# Patient Record
Sex: Male | Born: 1935 | Race: White | Hispanic: No | Marital: Married | Smoking: Former smoker
Health system: Southern US, Community
[De-identification: ages and names within clinical notes are randomized; demographics above are authoritative.]

## PROBLEM LIST (undated history)

## (undated) DIAGNOSIS — G459 Transient cerebral ischemic attack, unspecified: Secondary | ICD-10-CM

## (undated) DIAGNOSIS — R569 Unspecified convulsions: Secondary | ICD-10-CM

## (undated) DIAGNOSIS — M1990 Unspecified osteoarthritis, unspecified site: Secondary | ICD-10-CM

## (undated) DIAGNOSIS — I10 Essential (primary) hypertension: Secondary | ICD-10-CM

## (undated) HISTORY — PX: COLONOSCOPY: SHX5424

## (undated) HISTORY — PX: TONSILLECTOMY: SUR1361

## (undated) HISTORY — PX: OTHER SURGICAL HISTORY: SHX169

## (undated) HISTORY — PX: ESOPHAGOGASTRODUODENOSCOPY: SHX1529

---

## 1974-08-06 HISTORY — PX: ANKLE SURGERY: SHX546

## 2017-05-28 ENCOUNTER — Encounter (INDEPENDENT_AMBULATORY_CARE_PROVIDER_SITE_OTHER): Payer: Self-pay | Admitting: Orthopaedic Surgery

## 2017-05-28 ENCOUNTER — Ambulatory Visit (INDEPENDENT_AMBULATORY_CARE_PROVIDER_SITE_OTHER): Payer: Medicare HMO | Admitting: Orthopaedic Surgery

## 2017-05-28 VITALS — BP 155/80 | HR 65 | Ht 69.0 in | Wt 155.0 lb

## 2017-05-28 DIAGNOSIS — M48061 Spinal stenosis, lumbar region without neurogenic claudication: Secondary | ICD-10-CM

## 2017-05-28 DIAGNOSIS — M9983 Other biomechanical lesions of lumbar region: Secondary | ICD-10-CM

## 2017-05-28 DIAGNOSIS — M48062 Spinal stenosis, lumbar region with neurogenic claudication: Secondary | ICD-10-CM | POA: Diagnosis not present

## 2017-05-28 NOTE — Progress Notes (Signed)
Office Visit Note   Patient: Matthew Mccullough           Date of Birth: 06-16-1934           MRN: [id number] Visit Date: 05/28/2017              Requested by: No referring provider defined for this encounter. PCP: Elijio Miles., MD   Assessment & Plan: Visit Diagnoses:  1. Spinal stenosis of lumbar region with neurogenic claudication   2. Foraminal stenosis of lumbar region         Central stenosis L4-5, Foraminal stenosis left L5-S1  Plan: Patient's currently doing better. If he has increase in his symptoms he once to consider  trying 1 more epidural. We discussed operative choices which would be central decompression L4-5 for the lateral recess and moderate central stenosis as well as left L5-S1 foraminotomy. He will call for productive epidural done here or he can go back and have it done at [facility]. No change in his medications. Gabapentin has given him some 50% relief.  Follow-Up Instructions: No Follow-up on file.   Orders:  No orders of the defined types were placed in this encounter.  No orders of the defined types were placed in this encounter.     Procedures: No procedures performed   Clinical Data: No additional findings.   Subjective: Chief Complaint  Patient presents with  . Lower Back - Pain    HPI 83 year old male seen with back and left leg pain more so than right. He's had epidurals in the past with some relief. Currently feels like he is doing somewhat better. Tramadol did not seem to help him but he's been taking some gabapentin and that seems to improve his symptoms the probably 50%. He denies previous lumbar surgery. He's had an MRI scan done on 04/21/2017 which shows moderate central stenosis at L4-5 with bilateral lateral recess stenosis. L5-S1 shows severe left neural foraminal stenosis. Incidental finding is a 3.2 distal aortic aneurysm stable with an endograft. Patient avoids going out walking in the neighborhood with his wife but he rides and  excise bike for cardiovascular fitness. He's not noticed that he's had to stop when he standing at some functions are in line at an event.  Review of Systems positive for history stomach problems, diabetes, hypertension, lumbar stenosis problems. 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 Previous aortic aneurysm endograft.   Objective: Vital Signs: BP (!) 155/80   Pulse 65   Ht 5\' 9"  (1.753 m)   Wt 155 lb (70.3 kg)   BMI 22.89 kg/m   Physical Exam  Constitutional: He is oriented to person, place, and time. He appears well-developed and well-nourished.  HENT:  Head: Normocephalic and atraumatic.  Eyes: Pupils are equal, round, and reactive to light. EOM are normal.  Neck: No tracheal deviation present. No thyromegaly present.  Cardiovascular: Normal rate.   Pulmonary/Chest: Effort normal. He has no wheezes.  Abdominal: Soft. Bowel sounds are normal.  Neurological: He is alert and oriented to person, place, and time.  Skin: Skin is warm and dry. Capillary refill takes less than 2 seconds.  Psychiatric: He has a normal mood and affect. His behavior is normal. Judgment and thought content normal.    Ortho Exam knee and ankle jerks are 1+ and symmetrical. Anterior tib EHL gastrocsoleus is strong. Dorsal foot sensation is intact no pitting edema negative straight leg raising 90. Good quad strength. A mature with heel toe gait. One episode with the rapidly  turning changing he stumbled but does not report any falls.  Specialty Comments:  No specialty comments available.  Imaging: Lumbar MRI scan from 04/21/2017 as above reviewed on E with patient as well as report images also his wife and daughter.   PMFS History: There are no active problems to display for this patient.  No past medical history on file.  No family history on file.  No past surgical history on file. Social History   Occupational History  . Not on file.   Social History Main Topics  . Smoking status: Former Smoker    Quit  date: 1980  . Smokeless tobacco: Never Used  . Alcohol use Yes     Comment: wine  . Drug use: No  . Sexual activity: Not on file

## 2017-07-22 ENCOUNTER — Telehealth (INDEPENDENT_AMBULATORY_CARE_PROVIDER_SITE_OTHER): Payer: Self-pay | Admitting: Orthopedic Surgery

## 2017-07-22 NOTE — Telephone Encounter (Signed)
Patient's daughter, Olegario MessierKathy, called to schedule lumbar spine surgery for her father.  They would like to have this done some time in January.  Please review and fill out surgery sheet and I will call Olegario MessierKathy back to schedule. Thank you!

## 2017-07-23 NOTE — Telephone Encounter (Signed)
Blue sheet done. Not sure where the disc is of his lumbar MRI. U can ask daughter when U call. Will need [facility]thanks blue sheet on your desk.  Thank you.

## 2017-08-08 ENCOUNTER — Telehealth (INDEPENDENT_AMBULATORY_CARE_PROVIDER_SITE_OTHER): Payer: Self-pay | Admitting: Radiology

## 2017-08-08 NOTE — Telephone Encounter (Signed)
Patient's daughter called, said patient has questions prior to scheduling surgery.  Would you be so kind as to call him?  [phone], thanks.

## 2017-08-08 NOTE — Telephone Encounter (Signed)
I called discussed.  

## 2017-08-20 ENCOUNTER — Ambulatory Visit (INDEPENDENT_AMBULATORY_CARE_PROVIDER_SITE_OTHER): Payer: Medicare HMO | Admitting: Orthopaedic Surgery

## 2017-09-03 ENCOUNTER — Ambulatory Visit (INDEPENDENT_AMBULATORY_CARE_PROVIDER_SITE_OTHER): Payer: Medicare HMO | Admitting: Orthopaedic Surgery

## 2017-09-03 ENCOUNTER — Encounter (INDEPENDENT_AMBULATORY_CARE_PROVIDER_SITE_OTHER): Payer: Self-pay | Admitting: Orthopaedic Surgery

## 2017-09-03 VITALS — Ht 68.0 in | Wt 163.0 lb

## 2017-09-03 DIAGNOSIS — M48062 Spinal stenosis, lumbar region with neurogenic claudication: Secondary | ICD-10-CM

## 2017-09-03 DIAGNOSIS — M5126 Other intervertebral disc displacement, lumbar region: Secondary | ICD-10-CM | POA: Diagnosis not present

## 2017-09-04 ENCOUNTER — Encounter (INDEPENDENT_AMBULATORY_CARE_PROVIDER_SITE_OTHER): Payer: Self-pay | Admitting: Orthopaedic Surgery

## 2017-09-04 DIAGNOSIS — M48062 Spinal stenosis, lumbar region with neurogenic claudication: Secondary | ICD-10-CM | POA: Insufficient documentation

## 2017-09-04 DIAGNOSIS — M5126 Other intervertebral disc displacement, lumbar region: Secondary | ICD-10-CM | POA: Insufficient documentation

## 2017-09-04 NOTE — Progress Notes (Signed)
Office Visit Note   Patient: Elizbeth SquiresDavid L Leonhart           Date of Birth: 1934/04/13           MRN: [id number] Visit Date: 09/03/2017              Requested by: Elijio MilesWeaver, John W., MD [address] [place], [place] [address] PCP: Elijio MilesWeaver, John W., MD   Assessment & Plan: Visit Diagnoses:  1.  L4-5 lumbar spinal stenosis with neurogenic claudication.  2.  Left L5-S1 disc protrusion with radiculopathy.   Plan: Impression at the L4-5 level there is lateral recess and central stenosis and left L5-S1 microdiscectomy foraminotomy.  Questions elicited and answered with surgery discussed questions were answered.  He understands and requests we proceed.    Follow-Up Instructions: Return in about 2 weeks (around 09/17/2017).   Orders:  No orders of the defined types were placed in this encounter.  No orders of the defined types were placed in this encounter.     Procedures: No procedures performed   Clinical Data: No additional findings.   Subjective: Chief Complaint  Patient presents with  . Lower Back - Pain, Follow-up    HPI 84 year old male returns wife today.  Discussed plan surgery for the lumbar spine for his L4-5 stenosis with some claudication symptoms at 100 feet left leg symptoms from lateral disc protrusion L5-S1.  Stop and stand or sit down or walking approximately 100 feet.  He gets relief with supine position.  His left leg pain is intermittent at times rated at severe.  He has had anti-inflammatories, epidural injections and also gabapentin symptoms.  Increase over the last year.  MRI scan 04/21/2017 showed severe neuroforaminal stenosis L5-S1 with impingement on the left L5 nerve root.  Bilateral lateral recess stenosis at L4-5 with moderate central stenosis at L4-5 level.  Severe right and moderate left neuroforaminal stenosis.  The last 3 years he has had several epidurals.  Patient has type 2 diabetes and was on metformin and had an episode with some hypoglycemia  requiring admission in the hospital and metformin has been stopped.  Review of Systems positive for hypertension on Norvasc and Lopressor, Neurontin 300 mg at night for his lumbar stenosis, history of aortic stent for 3.2 cm aneurysm with endograft.  Former smoker quit in 1980.  Normal carotid ultrasound and normal MRI and CT scan of the brain Oct. 2018 time of hypoglycemia.   Objective: Vital Signs: Ht 5\' 8"  (1.727 m)   Wt 163 lb (73.9 kg)   BMI 24.78 kg/m   Physical Exam  Constitutional: He is oriented to person, place, and time. He appears well-developed and well-nourished.  HENT:  Head: Normocephalic and atraumatic.  Eyes: EOM are normal. Pupils are equal, round, and reactive to light.  Neck: No tracheal deviation present. No thyromegaly present.  Cardiovascular: Normal rate.  Pulmonary/Chest: Effort normal. He has no wheezes.  Abdominal: Soft. Bowel sounds are normal.  Neurological: He is alert and oriented to person, place, and time.  Skin: Skin is warm and dry. Capillary refill takes less than 2 seconds.  Psychiatric: He has a normal mood and affect. His behavior is normal. Judgment and thought content normal.    Ortho Exam left some sciatic notch tenderness.  Distal pulses are intact.  Tib EHL is active.  Ambulate.  Hip range of motion.  No peripheral edema.  Specialty Comments:  No specialty comments available.  Imaging: MRI scan 04/21/2017 shows severe left neuroforaminal  stenosis L5-S1 with impingement on the exiting left L5 nerve root.  Bilateral lateral recess stenosis at L4-5 with moderate spinal stenosis and severe right and moderate left neuroforaminal stenosis.  3.2 cm distal abdominal aortic aneurysm with endograft.   PMFS History: Patient Active Problem List   Diagnosis Date Noted  . Spinal stenosis of lumbar region with neurogenic claudication 09/04/2017  . Protrusion of lumbar intervertebral disc 09/04/2017   History reviewed. No pertinent past medical  history.  History reviewed. No pertinent family history.  History reviewed. No pertinent surgical history. Social History   Occupational History  . Not on file  Tobacco Use  . Smoking status: Former Smoker    Last attempt to quit: 1980    Years since quitting: 39.1  . Smokeless tobacco: Never Used  Substance and Sexual Activity  . Alcohol use: Yes    Comment: wine  . Drug use: No  . Sexual activity: Not on file

## 2017-09-05 NOTE — Pre-Procedure Instructions (Signed)
Matthew SquiresDavid L Mccullough  09/05/2017      [facility] [place], [place] - [id number] [place] [id number] Phone: [phone] Fax: [phone]    Your procedure is scheduled on February 4  [facility] Admitting at 1030 A.M.  Call this number if you have problems the morning of surgery:  [phone]   Remember:  Do not eat food or drink liquids after midnight.  Continue all medications as directed by your physician except follow these medication instructions before surgery below   Take these medicines the morning of surgery with A SIP OF WATER  amLODipine (NORVASC) fluticasone (FLONASE) HYDROcodone-acetaminophen (NORCO)  metoprolol tartrate (LOPRESSOR) omeprazole (PRILOSEC)  7 days prior to surgery STOP taking any Aspirin(unless otherwise instructed by your surgeon), Aleve, Naproxen, Ibuprofen, Motrin, Advil, Goody's, BC's, all herbal medications, fish oil, and all vitamins  Follow your doctors instructions regarding your Aspirin.  If no instructions were given by your doctor, then you will need to call the prescribing office office to get instructions.      Do not wear jewelry  Do not wear lotions, powders, or cologne, or deodorant.  Men may shave face and neck.  Do not bring valuables to the hospital.  [facility] is not responsible for any belongings or valuables.  Contacts, dentures or bridgework may not be worn into surgery.  Leave your suitcase in the car.  After surgery it may be brought to your room.  For patients admitted to the hospital, discharge time will be determined by your treatment team.  Patients discharged the day of surgery will not be allowed to drive home.    Special instructions:   [place]- Preparing For Surgery  Before surgery, you can play an important role. Because skin is not sterile, your skin needs to be as free of germs as possible. You can reduce  the number of germs on your skin by washing with CHG (chlorahexidine gluconate) Soap before surgery.  CHG is an antiseptic cleaner which kills germs and bonds with the skin to continue killing germs even after washing.  Please do not use if you have an allergy to CHG or antibacterial soaps. If your skin becomes reddened/irritated stop using the CHG.  Do not shave (including legs and underarms) for at least 48 hours prior to first CHG shower. It is OK to shave your face.  Please follow these instructions carefully.   1. Shower the NIGHT BEFORE SURGERY and the MORNING OF SURGERY with CHG.   2. If you chose to wash your hair, wash your hair first as usual with your normal shampoo.  3. After you shampoo, rinse your hair and body thoroughly to remove the shampoo.  4. Use CHG as you would any other liquid soap. You can apply CHG directly to the skin and wash gently with a scrungie or a clean washcloth.   5. Apply the CHG Soap to your body ONLY FROM THE NECK DOWN.  Do not use on open wounds or open sores. Avoid contact with your eyes, ears, mouth and genitals (private parts). Wash Face and genitals (private parts)  with your normal soap.  6. Wash thoroughly, paying special attention to the area where your surgery will be performed.  7. Thoroughly rinse your body with warm water from the neck down.  8. DO NOT shower/wash with your normal soap after using and rinsing off the CHG Soap.  9. Pat yourself dry with a CLEAN TOWEL.  10. Wear CLEAN PAJAMAS to bed the night before surgery, wear comfortable clothes the morning of surgery  11. Place CLEAN SHEETS on your bed the night of your first shower and DO NOT SLEEP WITH PETS.    Day of Surgery: Do not apply any deodorants/lotions. Please wear clean clothes to the hospital/surgery center.      Please read over the following fact sheets that you were given.

## 2017-09-06 ENCOUNTER — Encounter (HOSPITAL_COMMUNITY): Payer: Self-pay

## 2017-09-06 ENCOUNTER — Encounter (HOSPITAL_COMMUNITY)
Admission: RE | Admit: 2017-09-06 | Discharge: 2017-09-06 | Disposition: A | Discharge status: Home / Self Care | Payer: Medicare HMO | Source: Ambulatory Visit | Attending: Orthopaedic Surgery | Admitting: Orthopaedic Surgery

## 2017-09-06 ENCOUNTER — Other Ambulatory Visit (HOSPITAL_COMMUNITY): Payer: Self-pay

## 2017-09-06 ENCOUNTER — Other Ambulatory Visit: Payer: Self-pay

## 2017-09-06 ENCOUNTER — Encounter (HOSPITAL_COMMUNITY)
Admission: RE | Admit: 2017-09-06 | Discharge: 2017-09-06 | Disposition: A | Discharge status: Home / Self Care | Payer: Medicare HMO | Source: Ambulatory Visit | Attending: Surgery | Admitting: Surgery

## 2017-09-06 DIAGNOSIS — Z79899 Other long term (current) drug therapy: Secondary | ICD-10-CM | POA: Diagnosis not present

## 2017-09-06 DIAGNOSIS — I10 Essential (primary) hypertension: Secondary | ICD-10-CM | POA: Diagnosis not present

## 2017-09-06 DIAGNOSIS — M48062 Spinal stenosis, lumbar region with neurogenic claudication: Secondary | ICD-10-CM | POA: Diagnosis present

## 2017-09-06 DIAGNOSIS — Z7982 Long term (current) use of aspirin: Secondary | ICD-10-CM | POA: Diagnosis not present

## 2017-09-06 DIAGNOSIS — Z01818 Encounter for other preprocedural examination: Secondary | ICD-10-CM

## 2017-09-06 DIAGNOSIS — Z7951 Long term (current) use of inhaled steroids: Secondary | ICD-10-CM | POA: Diagnosis not present

## 2017-09-06 DIAGNOSIS — Z8673 Personal history of transient ischemic attack (TIA), and cerebral infarction without residual deficits: Secondary | ICD-10-CM | POA: Diagnosis not present

## 2017-09-06 DIAGNOSIS — Z87891 Personal history of nicotine dependence: Secondary | ICD-10-CM | POA: Diagnosis not present

## 2017-09-06 HISTORY — DX: Unspecified convulsions: R56.9

## 2017-09-06 HISTORY — DX: Essential (primary) hypertension: I10

## 2017-09-06 HISTORY — DX: Transient cerebral ischemic attack, unspecified: G45.9

## 2017-09-06 HISTORY — DX: Unspecified osteoarthritis, unspecified site: M19.90

## 2017-09-06 LAB — CBC
HCT: 36.8 % — ABNORMAL LOW (ref 39.0–52.0)
Hemoglobin: 12.4 g/dL — ABNORMAL LOW (ref 13.0–17.0)
MCH: 31.5 pg (ref 26.0–34.0)
MCHC: 33.7 g/dL (ref 30.0–36.0)
MCV: 93.4 fL (ref 78.0–100.0)
PLATELETS: 194 10*3/uL (ref 150–400)
RBC: 3.94 MIL/uL — AB (ref 4.22–5.81)
RDW: 13.2 % (ref 11.5–15.5)
WBC: 8.2 10*3/uL (ref 4.0–10.5)

## 2017-09-06 LAB — COMPREHENSIVE METABOLIC PANEL
ALT: 15 U/L — AB (ref 17–63)
AST: 21 U/L (ref 15–41)
Albumin: 3.7 g/dL (ref 3.5–5.0)
Alkaline Phosphatase: 60 U/L (ref 38–126)
Anion gap: 10 (ref 5–15)
BUN: 21 mg/dL — AB (ref 6–20)
CHLORIDE: 104 mmol/L (ref 101–111)
CO2: 21 mmol/L — AB (ref 22–32)
CREATININE: 1.59 mg/dL — AB (ref 0.61–1.24)
Calcium: 9.1 mg/dL (ref 8.9–10.3)
GFR calc Af Amer: 45 mL/min — ABNORMAL LOW (ref >60)
GFR calc non Af Amer: 39 mL/min — ABNORMAL LOW (ref >60)
GLUCOSE: 117 mg/dL — AB (ref 65–99)
Potassium: 4.8 mmol/L (ref 3.5–5.1)
SODIUM: 135 mmol/L (ref 135–145)
Total Bilirubin: 0.8 mg/dL (ref 0.3–1.2)
Total Protein: 6.3 g/dL — ABNORMAL LOW (ref 6.5–8.1)

## 2017-09-06 LAB — URINALYSIS, ROUTINE W REFLEX MICROSCOPIC
BILIRUBIN URINE: NEGATIVE
Glucose, UA: NEGATIVE mg/dL
HGB URINE DIPSTICK: NEGATIVE
Ketones, ur: NEGATIVE mg/dL
Leukocytes, UA: NEGATIVE
NITRITE: NEGATIVE
PH: 5.5 (ref 5.0–8.0)
Protein, ur: NEGATIVE mg/dL
SPECIFIC GRAVITY, URINE: 1.01 (ref 1.005–1.030)

## 2017-09-06 LAB — SURGICAL PCR SCREEN
MRSA, PCR: NEGATIVE
Staphylococcus aureus: NEGATIVE

## 2017-09-06 NOTE — Progress Notes (Signed)
Anesthesia Chart Review:  Pt is an 84 year old male scheduled for L4-5 decompression, left L5-S1 foraminotomies on 09/09/2017 with Annell GreeningMark Yates, MD  - PCP is Daneil DolinJohn Weaver, MD who cleared pt for surgery (notes in care everywhere)  - Saw neurologist Charmayne SheerEric Moser, MD 05/20/17 after hospitalization for confusion. No specific dx made (notes in care everywhere)    PMH includes: HTN, AAA (s/p repair). Former smoker. BMI 26. S/p aortic aneurysm repair.   - Hospitalized 10/1 - 05/09/17 ([facility]) for confusion and difficulty speaking. No definitive dx (TIA vs seizure vs medication side effects). Metformin discontinued (thought to contribute to hypoglycemia) and tramadol discontinued. B12 supplement started for low normal B12 levels  Medications include:   BP 123/67   Pulse (!) 55   Temp (!) 36.4 C (Oral)   Resp 18   Ht 5\' 8"  (1.727 m)   Wt 171 lb 3.2 oz (77.7 kg)   SpO2 100%   BMI 26.03 kg/m   Preoperative labs reviewed.    CXR 09/06/17: No active cardiopulmonary disease.  EKG 05/2017 requested from [facility]  Echo 05/07/17 (care everywhere):  - Normal left ventricular size and systolic function with no appreciable segmental abnormality. - Diastolic function appears normal - Mild aortic regurgitation. - Normal right ventricular size and function.  Carotid duplex 05/07/17 (care everywhere):  - 1-39% stenosis bilaterally in the internal carotid arteries. No significant plaque noted.   I spoke with pt and his wife at pre-admission testing.  He is concerned about the risk for memory loss with anesthesia given his recent unexplained episode of confusion this past October.  I explained he is at risk for memory impairment after anesthesia given his age and his confusion episode and that I regretfully cannot promise him he won't have a memory issue after surgery.  He understood, and knows he can discuss his concerns further with assigned anesthesiologist day of surgery.   If EKG acceptable, I anticipate pt can  proceed with surgery as scheduled.   Rica Mastngela Rodina Pinales, FNP-BC [facility]/Anesthesiology Phone: [phone] 09/06/2017 4:13 PM

## 2017-09-06 NOTE — Progress Notes (Signed)
PCP - Daneil DolinJohn Weaver Cardiologist - Jeralene HuffKurt Daniels - requesting records  Chest x-ray - 09/06/17 EKG - 05/08/17 Stress Test - requesting  ECHO - in shadow chart 2018 Cardiac Cath - denies    Aspirin Instructions: last dose 09/05/16 - not for blood thinning  Anesthesia review: YES  Patient denies shortness of breath, fever, cough and chest pain at PAT appointment   Patient verbalized understanding of instructions that were given to them at the PAT appointment. Patient was also instructed that they will need to review over the PAT instructions again at home before surgery.

## 2017-09-09 ENCOUNTER — Inpatient Hospital Stay (HOSPITAL_COMMUNITY): Payer: Medicare HMO | Admitting: Emergency Medicine

## 2017-09-09 ENCOUNTER — Encounter (HOSPITAL_COMMUNITY)
Admission: RE | Disposition: A | Discharge status: Home / Self Care | Payer: Self-pay | Source: Ambulatory Visit | Attending: Orthopaedic Surgery

## 2017-09-09 ENCOUNTER — Encounter (HOSPITAL_COMMUNITY): Payer: Self-pay | Admitting: Certified Registered"

## 2017-09-09 ENCOUNTER — Observation Stay (HOSPITAL_COMMUNITY)
Admission: RE | Admit: 2017-09-09 | Discharge: 2017-09-10 | Disposition: A | Discharge status: Home / Self Care | Payer: Medicare HMO | Source: Ambulatory Visit | Attending: Orthopaedic Surgery | Admitting: Orthopaedic Surgery

## 2017-09-09 ENCOUNTER — Inpatient Hospital Stay (HOSPITAL_COMMUNITY): Payer: Medicare HMO

## 2017-09-09 DIAGNOSIS — Z7982 Long term (current) use of aspirin: Secondary | ICD-10-CM | POA: Insufficient documentation

## 2017-09-09 DIAGNOSIS — M5126 Other intervertebral disc displacement, lumbar region: Secondary | ICD-10-CM | POA: Diagnosis not present

## 2017-09-09 DIAGNOSIS — Z419 Encounter for procedure for purposes other than remedying health state, unspecified: Secondary | ICD-10-CM

## 2017-09-09 DIAGNOSIS — M48062 Spinal stenosis, lumbar region with neurogenic claudication: Principal | ICD-10-CM | POA: Insufficient documentation

## 2017-09-09 DIAGNOSIS — Z8673 Personal history of transient ischemic attack (TIA), and cerebral infarction without residual deficits: Secondary | ICD-10-CM | POA: Insufficient documentation

## 2017-09-09 DIAGNOSIS — I10 Essential (primary) hypertension: Secondary | ICD-10-CM | POA: Diagnosis not present

## 2017-09-09 DIAGNOSIS — Z7951 Long term (current) use of inhaled steroids: Secondary | ICD-10-CM | POA: Insufficient documentation

## 2017-09-09 DIAGNOSIS — Z87891 Personal history of nicotine dependence: Secondary | ICD-10-CM | POA: Diagnosis not present

## 2017-09-09 DIAGNOSIS — M48061 Spinal stenosis, lumbar region without neurogenic claudication: Secondary | ICD-10-CM | POA: Diagnosis present

## 2017-09-09 DIAGNOSIS — Z79899 Other long term (current) drug therapy: Secondary | ICD-10-CM | POA: Insufficient documentation

## 2017-09-09 HISTORY — PX: LUMBAR LAMINECTOMY/DECOMPRESSION MICRODISCECTOMY: SHX5026

## 2017-09-09 SURGERY — LUMBAR LAMINECTOMY/DECOMPRESSION MICRODISCECTOMY
Anesthesia: General

## 2017-09-09 MED ORDER — METHOCARBAMOL 1000 MG/10ML IJ SOLN
500.0000 mg | Freq: Four times a day (QID) | INTRAVENOUS | Status: DC | PRN
  Filled 2017-09-09: qty 5

## 2017-09-09 MED ORDER — SODIUM CHLORIDE 0.9% FLUSH: 3.0000 mL | INTRAVENOUS | Status: DC | PRN

## 2017-09-09 MED ORDER — GABAPENTIN 300 MG PO CAPS
300.0000 mg | ORAL_CAPSULE | Freq: Every day | ORAL | Status: DC
  Administered 2017-09-09: 300 mg via ORAL
  Filled 2017-09-09: qty 1

## 2017-09-09 MED ORDER — ROCURONIUM BROMIDE 10 MG/ML (PF) SYRINGE
PREFILLED_SYRINGE | INTRAVENOUS | Status: AC
  Filled 2017-09-09: qty 5

## 2017-09-09 MED ORDER — AMLODIPINE BESYLATE 5 MG PO TABS: 5.0000 mg | ORAL_TABLET | Freq: Every day | ORAL | Status: DC

## 2017-09-09 MED ORDER — BUPIVACAINE HCL (PF) 0.25 % IJ SOLN
INTRAMUSCULAR | Status: DC | PRN
  Administered 2017-09-09: 10 mL

## 2017-09-09 MED ORDER — EPHEDRINE SULFATE 50 MG/ML IJ SOLN
INTRAMUSCULAR | Status: DC | PRN
  Administered 2017-09-09 (×5): 10 mg via INTRAVENOUS
  Administered 2017-09-09: 5 mg via INTRAVENOUS
  Administered 2017-09-09: 10 mg via INTRAVENOUS

## 2017-09-09 MED ORDER — ONDANSETRON HCL 4 MG/2ML IJ SOLN: 4.0000 mg | Freq: Once | INTRAMUSCULAR | Status: DC | PRN

## 2017-09-09 MED ORDER — CHLORHEXIDINE GLUCONATE 4 % EX LIQD: 60.0000 mL | Freq: Once | CUTANEOUS | Status: DC

## 2017-09-09 MED ORDER — ROCURONIUM BROMIDE 100 MG/10ML IV SOLN
INTRAVENOUS | Status: DC | PRN
  Administered 2017-09-09: 50 mg via INTRAVENOUS
  Administered 2017-09-09: 20 mg via INTRAVENOUS
  Administered 2017-09-09: 10 mg via INTRAVENOUS

## 2017-09-09 MED ORDER — LISINOPRIL 20 MG PO TABS: 40.0000 mg | ORAL_TABLET | Freq: Every day | ORAL | Status: DC

## 2017-09-09 MED ORDER — CEFAZOLIN SODIUM-DEXTROSE 1-4 GM/50ML-% IV SOLN
1.0000 g | Freq: Three times a day (TID) | INTRAVENOUS | Status: AC
  Administered 2017-09-09: 1 g via INTRAVENOUS
  Filled 2017-09-09: qty 50

## 2017-09-09 MED ORDER — SUGAMMADEX SODIUM 200 MG/2ML IV SOLN
INTRAVENOUS | Status: DC | PRN
  Administered 2017-09-09: 150 mg via INTRAVENOUS

## 2017-09-09 MED ORDER — METHOCARBAMOL 500 MG PO TABS
500.0000 mg | ORAL_TABLET | Freq: Four times a day (QID) | ORAL | Status: DC | PRN
  Administered 2017-09-09 – 2017-09-10 (×2): 500 mg via ORAL
  Filled 2017-09-09 (×2): qty 1

## 2017-09-09 MED ORDER — PROPOFOL 10 MG/ML IV BOLUS
INTRAVENOUS | Status: DC | PRN
  Administered 2017-09-09: 120 mg via INTRAVENOUS

## 2017-09-09 MED ORDER — SODIUM CHLORIDE 0.9 % IV SOLN
INTRAVENOUS | Status: DC
Start: 2017-09-09 — End: 2017-09-10

## 2017-09-09 MED ORDER — FLUTICASONE PROPIONATE 50 MCG/ACT NA SUSP
1.0000 | Freq: Every day | NASAL | Status: DC | PRN
  Filled 2017-09-09: qty 16

## 2017-09-09 MED ORDER — MENTHOL 3 MG MT LOZG: 1.0000 | LOZENGE | OROMUCOSAL | Status: DC | PRN

## 2017-09-09 MED ORDER — MIDAZOLAM HCL 2 MG/2ML IJ SOLN
INTRAMUSCULAR | Status: AC
  Filled 2017-09-09: qty 2

## 2017-09-09 MED ORDER — PHENYLEPHRINE HCL 10 MG/ML IJ SOLN
INTRAMUSCULAR | Status: DC | PRN
  Administered 2017-09-09: 80 ug via INTRAVENOUS
  Administered 2017-09-09: 40 ug via INTRAVENOUS

## 2017-09-09 MED ORDER — BUPIVACAINE HCL (PF) 0.25 % IJ SOLN
INTRAMUSCULAR | Status: AC
  Filled 2017-09-09: qty 30

## 2017-09-09 MED ORDER — LIDOCAINE HCL (CARDIAC) 20 MG/ML IV SOLN
INTRAVENOUS | Status: DC | PRN
  Administered 2017-09-09: 100 mg via INTRAVENOUS

## 2017-09-09 MED ORDER — METOPROLOL TARTRATE 25 MG PO TABS
50.0000 mg | ORAL_TABLET | Freq: Two times a day (BID) | ORAL | Status: DC
  Administered 2017-09-09: 50 mg via ORAL
  Filled 2017-09-09: qty 2

## 2017-09-09 MED ORDER — ACETAMINOPHEN 325 MG PO TABS: 650.0000 mg | ORAL_TABLET | ORAL | Status: DC | PRN

## 2017-09-09 MED ORDER — SUGAMMADEX SODIUM 200 MG/2ML IV SOLN
INTRAVENOUS | Status: AC
  Filled 2017-09-09: qty 2

## 2017-09-09 MED ORDER — ONDANSETRON HCL 4 MG/2ML IJ SOLN
INTRAMUSCULAR | Status: DC | PRN
  Administered 2017-09-09: 4 mg via INTRAVENOUS

## 2017-09-09 MED ORDER — CEFAZOLIN SODIUM-DEXTROSE 2-4 GM/100ML-% IV SOLN
2.0000 g | INTRAVENOUS | Status: AC
  Administered 2017-09-09: 2 g via INTRAVENOUS
  Filled 2017-09-09: qty 100

## 2017-09-09 MED ORDER — DEXAMETHASONE SODIUM PHOSPHATE 4 MG/ML IJ SOLN
INTRAMUSCULAR | Status: DC | PRN
  Administered 2017-09-09: 10 mg via INTRAVENOUS

## 2017-09-09 MED ORDER — DEXAMETHASONE SODIUM PHOSPHATE 10 MG/ML IJ SOLN
INTRAMUSCULAR | Status: AC
  Filled 2017-09-09: qty 3

## 2017-09-09 MED ORDER — PHENOL 1.4 % MT LIQD: 1.0000 | OROMUCOSAL | Status: DC | PRN

## 2017-09-09 MED ORDER — ACETAMINOPHEN 650 MG RE SUPP: 650.0000 mg | RECTAL | Status: DC | PRN

## 2017-09-09 MED ORDER — FENTANYL CITRATE (PF) 250 MCG/5ML IJ SOLN
INTRAMUSCULAR | Status: AC
  Filled 2017-09-09: qty 5

## 2017-09-09 MED ORDER — ONDANSETRON HCL 4 MG PO TABS
4.0000 mg | ORAL_TABLET | Freq: Four times a day (QID) | ORAL | Status: DC | PRN
Start: 2017-09-09 — End: 2017-09-10

## 2017-09-09 MED ORDER — SIMVASTATIN 20 MG PO TABS
20.0000 mg | ORAL_TABLET | Freq: Every day | ORAL | Status: DC
  Administered 2017-09-09: 20 mg via ORAL
  Filled 2017-09-09: qty 1

## 2017-09-09 MED ORDER — PANTOPRAZOLE SODIUM 40 MG PO TBEC: 40.0000 mg | DELAYED_RELEASE_TABLET | Freq: Every day | ORAL | Status: DC

## 2017-09-09 MED ORDER — 0.9 % SODIUM CHLORIDE (POUR BTL) OPTIME
TOPICAL | Status: DC | PRN
  Administered 2017-09-09: 1000 mL

## 2017-09-09 MED ORDER — FENTANYL CITRATE (PF) 100 MCG/2ML IJ SOLN: 25.0000 ug | INTRAMUSCULAR | Status: DC | PRN

## 2017-09-09 MED ORDER — PROPOFOL 10 MG/ML IV BOLUS
INTRAVENOUS | Status: AC
  Filled 2017-09-09: qty 20

## 2017-09-09 MED ORDER — LACTATED RINGERS IV SOLN
INTRAVENOUS | Status: DC | PRN
  Administered 2017-09-09: 13:00:00 via INTRAVENOUS

## 2017-09-09 MED ORDER — TAMSULOSIN HCL 0.4 MG PO CAPS
0.4000 mg | ORAL_CAPSULE | Freq: Every day | ORAL | Status: DC
  Administered 2017-09-09: 0.4 mg via ORAL
  Filled 2017-09-09: qty 1

## 2017-09-09 MED ORDER — FENTANYL CITRATE (PF) 250 MCG/5ML IJ SOLN
INTRAMUSCULAR | Status: DC | PRN
  Administered 2017-09-09: 25 ug via INTRAVENOUS
  Administered 2017-09-09: 100 ug via INTRAVENOUS
  Administered 2017-09-09: 50 ug via INTRAVENOUS

## 2017-09-09 MED ORDER — ONDANSETRON HCL 4 MG/2ML IJ SOLN: 4.0000 mg | Freq: Four times a day (QID) | INTRAMUSCULAR | Status: DC | PRN

## 2017-09-09 MED ORDER — EPHEDRINE 5 MG/ML INJ
INTRAVENOUS | Status: AC
  Filled 2017-09-09: qty 10

## 2017-09-09 MED ORDER — SODIUM CHLORIDE 0.9% FLUSH: 3.0000 mL | Freq: Two times a day (BID) | INTRAVENOUS | Status: DC

## 2017-09-09 MED ORDER — PHENYLEPHRINE HCL 10 MG/ML IJ SOLN
INTRAVENOUS | Status: DC | PRN
  Administered 2017-09-09: 25 ug/min via INTRAVENOUS

## 2017-09-09 MED ORDER — LACTATED RINGERS IV SOLN
INTRAVENOUS | Status: DC
  Administered 2017-09-09: 11:00:00 via INTRAVENOUS

## 2017-09-09 MED ORDER — HYDROCODONE-ACETAMINOPHEN 10-325 MG PO TABS
1.0000 | ORAL_TABLET | ORAL | Status: DC | PRN
  Administered 2017-09-09 – 2017-09-10 (×3): 1 via ORAL
  Filled 2017-09-09 (×3): qty 1

## 2017-09-09 MED ORDER — THROMBIN (RECOMBINANT) 5000 UNITS EX SOLR
CUTANEOUS | Status: AC
  Filled 2017-09-09: qty 10000

## 2017-09-09 MED ORDER — LIDOCAINE 2% (20 MG/ML) 5 ML SYRINGE
INTRAMUSCULAR | Status: AC
  Filled 2017-09-09: qty 5

## 2017-09-09 MED ORDER — ONDANSETRON HCL 4 MG/2ML IJ SOLN
INTRAMUSCULAR | Status: AC
  Filled 2017-09-09: qty 6

## 2017-09-09 SURGICAL SUPPLY — 43 items
BUR ROUND FLUTED 4 SOFT TCH (BURR) ×2 IMPLANT
BUR ROUND FLUTED 4MM SOFT TCH (BURR) ×1
CANISTER SUCT 3000ML PPV (MISCELLANEOUS) ×3 IMPLANT
CLOSURE STERI-STRIP 1/2X4 (GAUZE/BANDAGES/DRESSINGS) ×1
CLSR STERI-STRIP ANTIMIC 1/2X4 (GAUZE/BANDAGES/DRESSINGS) ×2 IMPLANT
COVER SURGICAL LIGHT HANDLE (MISCELLANEOUS) ×3 IMPLANT
DECANTER SPIKE VIAL GLASS SM (MISCELLANEOUS) ×3 IMPLANT
DRAPE HALF SHEET 40X57 (DRAPES) ×6 IMPLANT
DRAPE MICROSCOPE LEICA (MISCELLANEOUS) ×3 IMPLANT
DRAPE SURG 17X23 STRL (DRAPES) ×3 IMPLANT
DRSG MEPILEX BORDER 4X4 (GAUZE/BANDAGES/DRESSINGS) ×3 IMPLANT
DRSG MEPILEX BORDER 4X8 (GAUZE/BANDAGES/DRESSINGS) ×3 IMPLANT
DURAPREP 26ML APPLICATOR (WOUND CARE) ×3 IMPLANT
ELECT REM PT RETURN 9FT ADLT (ELECTROSURGICAL) ×3
ELECTRODE REM PT RTRN 9FT ADLT (ELECTROSURGICAL) ×1 IMPLANT
FLOSEAL 10ML (HEMOSTASIS) ×3 IMPLANT
GLOVE BIOGEL PI IND STRL 8 (GLOVE) ×2 IMPLANT
GLOVE BIOGEL PI INDICATOR 8 (GLOVE) ×4
GLOVE ORTHO TXT STRL SZ7.5 (GLOVE) ×6 IMPLANT
GOWN STRL REUS W/ TWL LRG LVL3 (GOWN DISPOSABLE) ×2 IMPLANT
GOWN STRL REUS W/ TWL XL LVL3 (GOWN DISPOSABLE) ×1 IMPLANT
GOWN STRL REUS W/TWL 2XL LVL3 (GOWN DISPOSABLE) ×3 IMPLANT
GOWN STRL REUS W/TWL LRG LVL3 (GOWN DISPOSABLE) ×4
GOWN STRL REUS W/TWL XL LVL3 (GOWN DISPOSABLE) ×2
KIT BASIN OR (CUSTOM PROCEDURE TRAY) ×3 IMPLANT
KIT ROOM TURNOVER OR (KITS) ×3 IMPLANT
MANIFOLD NEPTUNE II (INSTRUMENTS) ×3 IMPLANT
NEEDLE HYPO 25GX1X1/2 BEV (NEEDLE) ×3 IMPLANT
NEEDLE SPNL 18GX3.5 QUINCKE PK (NEEDLE) ×3 IMPLANT
NS IRRIG 1000ML POUR BTL (IV SOLUTION) ×3 IMPLANT
PACK LAMINECTOMY ORTHO (CUSTOM PROCEDURE TRAY) ×3 IMPLANT
PAD ARMBOARD 7.5X6 YLW CONV (MISCELLANEOUS) ×9 IMPLANT
PATTIES SURGICAL .5 X.5 (GAUZE/BANDAGES/DRESSINGS) ×3 IMPLANT
PATTIES SURGICAL .75X.75 (GAUZE/BANDAGES/DRESSINGS) IMPLANT
SUT VIC AB 0 CT1 27 (SUTURE)
SUT VIC AB 0 CT1 27XBRD ANBCTR (SUTURE) IMPLANT
SUT VIC AB 1 CTX 36 (SUTURE) ×2
SUT VIC AB 1 CTX36XBRD ANBCTR (SUTURE) ×1 IMPLANT
SUT VIC AB 2-0 CT1 27 (SUTURE) ×2
SUT VIC AB 2-0 CT1 TAPERPNT 27 (SUTURE) ×1 IMPLANT
SUT VIC AB 3-0 X1 27 (SUTURE) ×3 IMPLANT
TOWEL OR 17X24 6PK STRL BLUE (TOWEL DISPOSABLE) ×3 IMPLANT
TOWEL OR 17X26 10 PK STRL BLUE (TOWEL DISPOSABLE) ×3 IMPLANT

## 2017-09-09 NOTE — Transfer of Care (Signed)
Immediate Anesthesia Transfer of Care Note  Patient: Matthew Mccullough  Procedure(s) Performed: L4-5 DECOMPRESSION, LEFT L5-S1 FORAMINOTOMY (N/A )  Patient Location: PACU  Anesthesia Type:General  Level of Consciousness: awake, alert  and oriented  Airway & Oxygen Therapy: Patient Spontanous Breathing and Patient connected to nasal cannula oxygen  Post-op Assessment: Report given to RN and Post -op Vital signs reviewed and stable  Post vital signs: Reviewed and stable  Last Vitals:  Vitals:   09/09/17 1033  BP: (!) 156/68  Pulse: (!) 54  Resp: 18  Temp: (!) 36.3 C  SpO2: 100%    Last Pain:  Vitals:   09/09/17 1033  TempSrc: Oral      Patients Stated Pain Goal: 3 (09/09/17 1059)  Complications: No apparent anesthesia complications

## 2017-09-09 NOTE — Op Note (Signed)
Preop diagnosis: Neurogenic claudication with L4-5 stenosis and left foraminal stenosis at L5-S1.  Postop diagnosis: Same  Procedure: L4-5 decompression, left L5 hemilaminectomy, foraminotomy and microdiscectomy.  Surgeon: Annell GreeningMark Brieanna Nau MD  Assistant: Zonia KiefJames Owens PA-C medically necessary and present for the entire procedure  EBL see anesthetic record  Anesthesia General.  Procedure after induction of general anesthesia patient placed prone careful padding and positioning.  Back was prepped with DuraPrep there squared with towels Betadine Steri-Drape.  Needle localization with the needle based on palpable landmarks at about the upper aspect of the disc space at L4-5 and the second placed at the level of sacrum at S1.  Initial x-ray was taken and these were adjusted sterile skin marker was used Betadine Steri-Drape laminectomy sheets and drapes had been applied.  Timeout procedure was completed.  Midline incision was made starting at L4 extending down to S1.  Spinous process of L4 was removed and laminectomy was performed at L4 right and left.  Bur was used to remove bone at the level of the pedicle thick chunks of ligament were removed there was lateral recess stenosis and moderate central stenosis at L4-5 multifactorial.  Disc was firm hard without herniation.  On the left side left hemilaminectomy was performed removing L5 and then removing thick chunks of ligament decompressing lateral recess and removing bone.  Following the left 5 through root out underneath the pedicle where there was foraminal stenosis from facet overhang.  This was trimmed back decompressing the nerve following the nerve out with foraminotomies performed.  Microdiscectomy was performed removing chunks of ligament out laterally which helped decompress the nerve root.  Irrigation.  Surgi-Flo was placed in the lateral gutters operative field was dry at the time of closure standard layered closure was performed postop dressing and  transfer the care of room.

## 2017-09-09 NOTE — Interval H&P Note (Signed)
History and Physical Interval Note:  09/09/2017 12:28 PM  Matthew Mccullough  has presented today for surgery, with the diagnosis of L4-5 Stenosis, Left L5-S1 Foraminal Stenosis  The various methods of treatment have been discussed with the patient and family. After consideration of risks, benefits and other options for treatment, the patient has consented to  Procedure(s): L4-5 DECOMPRESSION, LEFT L5-S1 FORAMINOTOMY (N/A) as a surgical intervention .  The patient's history has been reviewed, patient examined, no change in status, stable for surgery.  I have reviewed the patient's chart and labs.  Questions were answered to the patient's satisfaction.     [name] C Jonavin Seder   

## 2017-09-09 NOTE — Interval H&P Note (Signed)
History and Physical Interval Note:  09/09/2017 12:28 PM  Matthew Squiresavid L Schexnider  has presented today for surgery, with the diagnosis of L4-5 Stenosis, Left L5-S1 Foraminal Stenosis  The various methods of treatment have been discussed with the patient and family. After consideration of risks, benefits and other options for treatment, the patient has consented to  Procedure(s): L4-5 DECOMPRESSION, LEFT L5-S1 FORAMINOTOMY (N/A) as a surgical intervention .  The patient's history has been reviewed, patient examined, no change in status, stable for surgery.  I have reviewed the patient's chart and labs.  Questions were answered to the patient's satisfaction.     Eldred MangesMark C Jenniah Bhavsar

## 2017-09-09 NOTE — H&P (Signed)
Matthew Mccullough is an 84 y.o. male.   Chief Complaint: back pain and leg pain HPI: patient with hx of L4-5 and L5-S1 stenosis and above complaint presents for surgical intervention.  Failed conservative treatment.    Past Medical History:  Diagnosis Date  . Arthritis   . Hypertension   . Seizures (HCC)    end of 2018 tests completed "episode"  . TIA (transient ischemic attack)    confusion at the end of 2018    Past Surgical History:  Procedure Laterality Date  . ANKLE SURGERY Left 1976   metal implants  . aortic anyerusm repair    . COLONOSCOPY    . ESOPHAGOGASTRODUODENOSCOPY    . TONSILLECTOMY      No family history on file. Social History:  reports that he quit smoking about 39 years ago. he has never used smokeless tobacco. He reports that he drinks alcohol. He reports that he does not use drugs.  Allergies:  Allergies  Allergen Reactions  . Iodine-131 Itching  . Morphine Other (See Comments)    Felt hot and "out of place"    No medications prior to admission.    No results found for this or any previous visit (from the past 48 hour(s)). No results found.  Review of Systems  Constitutional: Negative.   HENT: Negative.   Respiratory: Negative.   Cardiovascular: Negative.   Genitourinary: Negative.   Musculoskeletal: Positive for back pain.  Skin: Negative.   Neurological: Positive for tingling.  Psychiatric/Behavioral: Negative.     There were no vitals taken for this visit. Physical Exam  Constitutional: He is oriented to person, place, and time. No distress.  HENT:  Head: Normocephalic.  Eyes: Pupils are equal, round, and reactive to light.  Respiratory: No respiratory distress.  GI: He exhibits no distension.  Musculoskeletal:  Ortho Exam left some sciatic notch tenderness.  Distal pulses are intact.  Tib EHL is active.  Ambulate.  Hip range of motion.  No peripheral edema  Neurological: He is alert and oriented to person, place, and time.  Skin:  Skin is warm and dry.  Psychiatric: He has a normal mood and affect.     Assessment/Plan L4-5 and L5-S1 stenosis.   We will proceed with L4-5 DECOMPRESSION, LEFT L5-S1 FORAMINOTOMY as scheduled.  Surgical procedure along with possible risks and complications discussed.  All questions answered.    Zonia KiefJames Owens, PA-C 09/09/2017, 6:34 AM

## 2017-09-09 NOTE — Anesthesia Preprocedure Evaluation (Signed)

## 2017-09-09 NOTE — Anesthesia Procedure Notes (Signed)
Procedure Name: Intubation Date/Time: 09/09/2017 1:27 PM Performed by: Glynda Jaeger, CRNA Pre-anesthesia Checklist: Patient identified, Emergency Drugs available, Suction available and Patient being monitored Patient Re-evaluated:Patient Re-evaluated prior to induction Oxygen Delivery Method: Circle System Utilized Preoxygenation: Pre-oxygenation with 100% oxygen Induction Type: IV induction Ventilation: Mask ventilation without difficulty Laryngoscope Size: Mac and 3 Grade View: Grade III Tube type: Oral Tube size: 7.5 mm Number of attempts: 1 Airway Equipment and Method: Stylet and Oral airway Placement Confirmation: ETT inserted through vocal cords under direct vision,  positive ETCO2 and breath sounds checked- equal and bilateral Secured at: 23 cm Tube secured with: Tape Dental Injury: Teeth and Oropharynx as per pre-operative assessment  Difficulty Due To: Difficulty was unanticipated

## 2017-09-09 NOTE — Anesthesia Postprocedure Evaluation (Signed)
Anesthesia Post Note  Patient: Matthew SquiresDavid L Mccullough  Procedure(s) Performed: L4-5 DECOMPRESSION, LEFT L5-S1 FORAMINOTOMY (N/A )     Patient location during evaluation: PACU Anesthesia Type: General Level of consciousness: awake and alert Pain management: pain level controlled Vital Signs Assessment: post-procedure vital signs reviewed and stable Respiratory status: spontaneous breathing, nonlabored ventilation, respiratory function stable and patient connected to nasal cannula oxygen Cardiovascular status: blood pressure returned to baseline and stable Postop Assessment: no apparent nausea or vomiting Anesthetic complications: no    Last Vitals:  Vitals:   09/09/17 1523 09/09/17 1530  BP: 105/77   Pulse: 65 65  Resp: 13 13  Temp:    SpO2: 97% 97%    Last Pain:  Vitals:   09/09/17 1523  TempSrc:   PainSc: 0-No pain                 Ronique Simerly Flay

## 2017-09-10 ENCOUNTER — Encounter (HOSPITAL_COMMUNITY): Payer: Self-pay | Admitting: Orthopaedic Surgery

## 2017-09-10 DIAGNOSIS — M48062 Spinal stenosis, lumbar region with neurogenic claudication: Secondary | ICD-10-CM | POA: Diagnosis not present

## 2017-09-10 MED ORDER — HYDROCODONE-ACETAMINOPHEN 10-325 MG PO TABS: 1.0000 | ORAL_TABLET | ORAL | 0 refills | Status: AC | PRN

## 2017-09-10 NOTE — Progress Notes (Signed)
   Subjective: 1 Day Post-Op Procedure(s) (LRB): L4-5 DECOMPRESSION, LEFT L5-S1 FORAMINOTOMY (N/A) Patient reports pain as mild.  Leg pain gone. Walking in halls  Objective: Vital signs in last 24 hours: Temp:  [97.4 F (36.3 C)-98.1 F (36.7 C)] 98.1 F (36.7 C) (02/05 0400) Pulse Rate:  [54-79] 76 (02/05 0400) Resp:  [11-18] 18 (02/05 0400) BP: (105-171)/(54-77) 123/60 (02/05 0400) SpO2:  [94 %-100 %] 97 % (02/05 0400) Weight:  [171 lb 3.2 oz (77.7 kg)] 171 lb 3.2 oz (77.7 kg) (02/04 1033)  Intake/Output from previous day: 02/04 0701 - 02/05 0700 In: 900 [I.V.:900] Out: 100 [Blood:100] Intake/Output this shift: No intake/output data recorded.  No results for input(s): HGB in the last 72 hours. No results for input(s): WBC, RBC, HCT, PLT in the last 72 hours. No results for input(s): NA, K, CL, CO2, BUN, CREATININE, GLUCOSE, CALCIUM in the last 72 hours. No results for input(s): LABPT, INR in the last 72 hours.  Neurologically intact Dg Lumbar Spine 2-3 Views  Result Date: 09/09/2017 CLINICAL DATA:  84 year old male for L4-5 decompression and L5-S1 foramina me. Initial encounter. EXAM: LUMBAR SPINE - 2-3 VIEW COMPARISON:  04/21/2017 MR. FINDINGS: Three intraoperative lateral views of the lumbar spine submitted for review. Level assignment as per prior MR with last fully open disc space L5-S1. First film submitted for review labeled 09/09/2017 01:26 p.m. reveals superior metallic probe overlying the L4 spinous process and inferior metallic probe directed towards the S2 vertebral body. Second film submitted for review labeled 09/09/2017 1:28 p.m. reveals superior metallic probe overlying the L4 spinous process and inferior metallic probe directed towards the S1 vertebral body. Third film submitted for review labeled 09/09/2017 1:53 p.m. reveals metallic probe just inferior to the L5-S1 disc space. Vascular calcifications. IMPRESSION: Localization L5-S1 on third film submitted for  review as noted above. Electronically Signed   By: Lacy DuverneySteven  Olson M.D.   On: 09/09/2017 15:34    Assessment/Plan: 1 Day Post-Op Procedure(s) (LRB): L4-5 DECOMPRESSION, LEFT L5-S1 FORAMINOTOMY (N/A) Plan : discharge home . Office one week  Eldred MangesMark C Lenville Hibberd 09/10/2017, 7:29 AM

## 2017-09-10 NOTE — Care Management Obs Status (Signed)
MEDICARE OBSERVATION STATUS NOTIFICATION   Patient Details  Name: Matthew Mccullough MRN: [id number] Date of Birth: 11-Feb-1934   Medicare Observation Status Notification Given:  Yes    Durenda GuthrieBrady, Fuquan Wilson Naomi, RN 09/10/2017, 8:24 AM

## 2017-09-10 NOTE — Progress Notes (Signed)
Patient alert and oriented, mae's well, voiding adequate amount of urine, swallowing without difficulty, no c/o pain at time of discharge. Patient discharged home with family. Script and discharged instructions given to patient. Patient and family stated understanding of instructions given. Patient has an appointment with Dr. Yates  

## 2017-09-10 NOTE — Discharge Instructions (Signed)
OK to shower . Walk daily. Avoid bending and twisting. Take the pain medication only as you need it and slowly wean off.

## 2017-09-17 NOTE — Discharge Summary (Signed)
Patient ID: TESHAWN MOAN MRN: [id number] DOB/AGE: 10/30/1933 84 y.o.  Admit date: 09/09/2017 Discharge date: 09/17/2017  Admission Diagnoses:  Active Problems:   Lumbar stenosis   Discharge Diagnoses:  Active Problems:   Lumbar stenosis  status post Procedure(s): L4-5 DECOMPRESSION, LEFT L5-S1 FORAMINOTOMY  Past Medical History:  Diagnosis Date  . Arthritis   . Hypertension   . Seizures (HCC)    end of 2018 tests completed "episode"  . TIA (transient ischemic attack)    confusion at the end of 2018    Surgeries: Procedure(s): L4-5 DECOMPRESSION, LEFT L5-S1 FORAMINOTOMY on 09/09/2017   Consultants:   Discharged Condition: Improved  Hospital Course: MARKEE MATERA is an 84 y.o. male who was admitted 09/09/2017 for operative treatment of lumbar stenosis. Patient failed conservative treatments (please see the history and physical for the specifics) and had severe unremitting pain that affects sleep, daily activities and work/hobbies. After pre-op clearance, the patient was taken to the operating room on 09/09/2017 and underwent  Procedure(s): L4-5 DECOMPRESSION, LEFT L5-S1 FORAMINOTOMY.    Patient was given perioperative antibiotics:  Anti-infectives (From admission, onward)   Start     Dose/Rate Route Frequency Ordered Stop   09/09/17 2100  ceFAZolin (ANCEF) IVPB 1 g/50 mL premix     1 g 100 mL/hr over 30 Minutes Intravenous Every 8 hours 09/09/17 1649 09/09/17 2238   09/09/17 1034  ceFAZolin (ANCEF) IVPB 2g/100 mL premix     2 g 200 mL/hr over 30 Minutes Intravenous On call to O.R. 09/09/17 1034 09/09/17 1317       Patient was given sequential compression devices and early ambulation to prevent DVT.   Patient benefited maximally from hospital stay and there were no complications. At the time of discharge, the patient was urinating/moving their bowels without difficulty, tolerating a regular diet, pain is controlled with oral pain medications and they have been  cleared by PT/OT.   Recent vital signs: No data found.   Recent laboratory studies: No results for input(s): WBC, HGB, HCT, PLT, NA, K, CL, CO2, BUN, CREATININE, GLUCOSE, INR, CALCIUM in the last 72 hours.  Invalid input(s): PT, 2   Discharge Medications:   Allergies as of 09/10/2017      Reactions   Iodine-131 Itching   Morphine Other (See Comments)   Felt hot and "out of place"      Medication List    TAKE these medications   amLODipine 5 MG tablet Commonly known as:  NORVASC Take 5 mg by mouth daily.   aspirin EC 325 MG tablet Take 650 mg by mouth daily as needed for mild pain or moderate pain.   aspirin EC 81 MG tablet Take 162 mg by mouth daily.   eszopiclone 2 MG Tabs tablet Commonly known as:  LUNESTA Take 2 mg by mouth at bedtime as needed for sleep. Take immediately before bedtime   FLOMAX 0.4 MG Caps capsule Generic drug:  tamsulosin Take 0.4 mg by mouth at bedtime.   fluticasone 50 MCG/ACT nasal spray Commonly known as:  FLONASE Place 1 spray into the nose daily as needed for allergies.   gabapentin 300 MG capsule Commonly known as:  NEURONTIN Take 300 mg by mouth at bedtime.   HYDROcodone-acetaminophen 10-325 MG tablet Commonly known as:  NORCO Take 0.5 tablets by mouth 2 (two) times daily as needed for severe pain. What changed:  Another medication with the same name was added. Make sure you understand how and when to take  each.   HYDROcodone-acetaminophen 10-325 MG tablet Commonly known as:  NORCO Take 1 tablet by mouth every 4 (four) hours as needed for moderate pain. What changed:  You were already taking a medication with the same name, and this prescription was added. Make sure you understand how and when to take each.   JOCK ITCH RELIEF EX Apply 1 application topically daily as needed (jock itch).   lisinopril 40 MG tablet Commonly known as:  PRINIVIL,ZESTRIL Take 40 mg by mouth daily.   metoprolol tartrate 50 MG tablet Commonly known  as:  LOPRESSOR Take 50 mg by mouth 2 (two) times daily.   omeprazole 40 MG capsule Commonly known as:  PRILOSEC Take 40 mg by mouth daily as needed (acid reflux).   simvastatin 20 MG tablet Commonly known as:  ZOCOR TAKE 20 MG BY MOUTH DAILY AT BEDTIME   vitamin B-12 1000 MCG tablet Commonly known as:  CYANOCOBALAMIN Take 1,000 mcg by mouth daily.       Diagnostic Studies: Dg Chest 2 View  Result Date: 09/06/2017 CLINICAL DATA:  Pre-op for lower back surgery - hx of exsmoker, htn, seizures, TIA, no chest complaints EXAM: CHEST  2 VIEW COMPARISON:  07/18/2015 FINDINGS: Cardiac silhouette is normal in size and configuration. Normal mediastinal and hilar contours. Clear lungs. No pleural effusion or pneumothorax. Skeletal structures are intact. IMPRESSION: No active cardiopulmonary disease. Electronically Signed   By: Amie Portlandavid  Ormond M.D.   On: 09/06/2017 15:20   Dg Lumbar Spine 2-3 Views  Result Date: 09/09/2017 CLINICAL DATA:  84 year old male for L4-5 decompression and L5-S1 foramina me. Initial encounter. EXAM: LUMBAR SPINE - 2-3 VIEW COMPARISON:  04/21/2017 MR. FINDINGS: Three intraoperative lateral views of the lumbar spine submitted for review. Level assignment as per prior MR with last fully open disc space L5-S1. First film submitted for review labeled 09/09/2017 01:26 p.m. reveals superior metallic probe overlying the L4 spinous process and inferior metallic probe directed towards the S2 vertebral body. Second film submitted for review labeled 09/09/2017 1:28 p.m. reveals superior metallic probe overlying the L4 spinous process and inferior metallic probe directed towards the S1 vertebral body. Third film submitted for review labeled 09/09/2017 1:53 p.m. reveals metallic probe just inferior to the L5-S1 disc space. Vascular calcifications. IMPRESSION: Localization L5-S1 on third film submitted for review as noted above. Electronically Signed   By: Lacy DuverneySteven  Olson M.D.   On: 09/09/2017 15:34       Follow-up Information    Naida SleightOwens, Shaquanta Harkless M, PA-C Follow up in 1 week(s).   Specialties:  Physician Assistant, Orthopedic Surgery Why:  1-2 wks  . keep appt that you have with Fayrene FearingJames.  Contact information: [address] [place] [address] [phone]           Discharge Plan:  discharge to home  Disposition:     Signed: Zonia KiefJames Tyesha Joffe for Annell GreeningMark Yates MD [facility] 09/17/2017, 4:00 PM

## 2017-09-18 ENCOUNTER — Ambulatory Visit (INDEPENDENT_AMBULATORY_CARE_PROVIDER_SITE_OTHER): Payer: Medicare HMO | Admitting: Surgery

## 2017-09-18 ENCOUNTER — Encounter (INDEPENDENT_AMBULATORY_CARE_PROVIDER_SITE_OTHER): Payer: Self-pay | Admitting: Surgery

## 2017-09-18 VITALS — BP 133/66 | HR 54

## 2017-09-18 DIAGNOSIS — M25572 Pain in left ankle and joints of left foot: Secondary | ICD-10-CM

## 2017-09-18 DIAGNOSIS — G8929 Other chronic pain: Secondary | ICD-10-CM

## 2017-09-18 DIAGNOSIS — Z9889 Other specified postprocedural states: Secondary | ICD-10-CM

## 2017-09-18 NOTE — Progress Notes (Signed)
   Post-Op Visit Note   Patient: Matthew SquiresDavid L Claunch           Date of Birth: 01/30/1934           MRN: [id number] Visit Date: 09/18/2017 PCP: Elijio MilesWeaver, John W., MD   Assessment & Plan:  Chief Complaint:  Chief Complaint  Patient presents with  . Lower Back - Routine Post Op  Patient returns.  Little over a week out from lumbar decompression.  He is doing well please with his progress up to this point.  He is weaning off Norco.  He asked about driving and going to his office.  Patient also brought up an issue with chronic left ankle pain.  States that he is status post ORIF lateral malleolar fracture in the 1980s.  For years he has had off-and-on pain and swelling in the left ankle.  Aggravated with weightbearing.  In regards to his left ankle this continues to be problematic we did discuss getting an x-ray and possibly doing an intra-articular Marcaine/Depo-Medrol injection.  With his history of ORIF in the 1980s he very likely has posttraumatic degenerative changes of the ankle.  This was explained to him and his wife was present.  All questions answered.   Visit Diagnoses:  1. History of lumbar laminectomy for spinal cord decompression   2. Chronic pain of left ankle     Plan: I advised patient to gradually increase his walking distances over the next few weeks.  No lifting, twisting bending, aggressive activity.  Recommend that he not do any driving for at least 4-6 weeks.  Follow-Up Instructions: Return in about 4 weeks (around 10/16/2017).   Orders:  No orders of the defined types were placed in this encounter.  No orders of the defined types were placed in this encounter.   Imaging: No results found.  PMFS History: Patient Active Problem List   Diagnosis Date Noted  . Lumbar stenosis 09/09/2017  . Spinal stenosis of lumbar region with neurogenic claudication 09/04/2017  . Protrusion of lumbar intervertebral disc 09/04/2017   Past Medical History:  Diagnosis Date  .  Arthritis   . Hypertension   . Seizures (HCC)    end of 2018 tests completed "episode"  . TIA (transient ischemic attack)    confusion at the end of 2018    History reviewed. No pertinent family history.  Past Surgical History:  Procedure Laterality Date  . ANKLE SURGERY Left 1976   metal implants  . aortic anyerusm repair    . COLONOSCOPY    . ESOPHAGOGASTRODUODENOSCOPY    . LUMBAR LAMINECTOMY/DECOMPRESSION MICRODISCECTOMY N/A 09/09/2017   Procedure: L4-5 DECOMPRESSION, LEFT L5-S1 FORAMINOTOMY;  Surgeon: Eldred MangesYates, Mark C, MD;  Location: [facility] OR;  Service: Orthopedics;  Laterality: N/A;  . TONSILLECTOMY     Social History   Occupational History  . Not on file  Tobacco Use  . Smoking status: Former Smoker    Last attempt to quit: 1980    Years since quitting: 39.1  . Smokeless tobacco: Never Used  Substance and Sexual Activity  . Alcohol use: Yes    Comment: wine  . Drug use: No  . Sexual activity: Not on file   Exam Wound looks good.  New Steri-Strips applied.  No drainage or signs of infection.  Follow calves nontender.  Neurovascular intact.  Left ankle he does have some swelling.  He is mild to moderately tender across the anterior tibiotalar joint line.

## 2017-09-24 ENCOUNTER — Telehealth (INDEPENDENT_AMBULATORY_CARE_PROVIDER_SITE_OTHER): Payer: Self-pay | Admitting: Orthopaedic Surgery

## 2017-09-24 NOTE — Telephone Encounter (Signed)
Patient saw Fayrene FearingJames at his last appointment and mentioned to him about the arthritis in his ankle, he states he broke this ankle about 30 years ago and has a plate. Fayrene FearingJames mentioned doing an X-ray before the injection. Do you know anything about this and what kind of injection he would get? Please advise # [phone]

## 2017-09-25 NOTE — Telephone Encounter (Signed)
Fyi. I called and spoke with patient advising cortisone and marcaine injection per Fayrene FearingJames last note. Patient requested appt next week to have this done. Appt made. He wanted me to send message to Dr. Ophelia CharterYates to advise he was having this done.

## 2017-10-02 ENCOUNTER — Ambulatory Visit (INDEPENDENT_AMBULATORY_CARE_PROVIDER_SITE_OTHER): Payer: Medicare HMO | Admitting: Surgery

## 2017-10-02 ENCOUNTER — Encounter (INDEPENDENT_AMBULATORY_CARE_PROVIDER_SITE_OTHER): Payer: Self-pay | Admitting: Surgery

## 2017-10-02 ENCOUNTER — Ambulatory Visit (INDEPENDENT_AMBULATORY_CARE_PROVIDER_SITE_OTHER): Payer: Medicare HMO

## 2017-10-02 DIAGNOSIS — M25572 Pain in left ankle and joints of left foot: Secondary | ICD-10-CM

## 2017-10-02 DIAGNOSIS — M19072 Primary osteoarthritis, left ankle and foot: Secondary | ICD-10-CM

## 2017-10-02 MED ORDER — METHYLPREDNISOLONE ACETATE 40 MG/ML IJ SUSP
40.0000 mg | INTRAMUSCULAR | Status: AC | PRN
  Administered 2017-10-02: 40 mg via INTRA_ARTICULAR

## 2017-10-02 MED ORDER — LIDOCAINE HCL 1 % IJ SOLN
1.0000 mL | INTRAMUSCULAR | Status: AC | PRN
  Administered 2017-10-02: 1 mL

## 2017-10-02 MED ORDER — BUPIVACAINE HCL 0.5 % IJ SOLN
3.0000 mL | INTRAMUSCULAR | Status: AC | PRN
  Administered 2017-10-02: 3 mL via INTRA_ARTICULAR

## 2017-10-02 NOTE — Progress Notes (Signed)
Office Visit Note   Patient: Matthew Mccullough           Date of Birth: 23-Dec-1933           MRN: [id number] Visit Date: 10/02/2017              Requested by: Elijio Miles., MD [address] [place], [place] [address] PCP: Elijio Miles., MD   Assessment & Plan: Visit Diagnoses:  1. Pain in left ankle and joints of left foot   2. Arthritis of ankle, left     Plan: In hopes of giving patient some relief of pain I did elect to try injection.  Patient consent left ankle was prepped with Betadine articular Marcaine/Depo-Medrol injection was performed.  After sitting for a few minutes patient reported excellent relief of his ankle pain with Marcaine in place.  Follow-up with 4 weeks with Dr. Ophelia Charter for recheck of lumbar and ankle.    Follow-Up Instructions: Return in about 4 weeks (around 10/30/2017) for Dr. Ophelia Charter to recheck back and left ankle.   Orders:  Orders Placed This Encounter  Procedures  . Small Joint Inj  . Medium Joint Inj  . XR Ankle Complete Left   No orders of the defined types were placed in this encounter.     Procedures: Medium Joint Inj: L ankle on 10/02/2017 10:32 AM Indications: pain Details: 25 G needle, anterolateral approach Medications: 1 mL lidocaine 1 %; 3 mL bupivacaine 0.5 %; 40 mg methylPREDNISolone acetate 40 MG/ML Outcome: tolerated well, no immediate complications Consent was given by the patient. Patient was prepped and draped in the usual sterile fashion.       Clinical Data: No additional findings.   Subjective: Chief Complaint  Patient presents with  . Left Ankle - Pain    HPI Patient comes in for recheck of left ankle pain.  Last office visit I thought that he had posttraumatic arthritis from previous fracture and surgery several years ago.  States that he has been swelling.  Previous visit we discussed him coming back to try interarticular Marcaine/Depo-Medrol injection. Review of Systems No current cardiac pulmonary GI GU  issues  Objective: Vital Signs: There were no vitals taken for this visit.  Physical Exam Gait is antalgic.  Still continues to have some left ankle swelling.  Anterior tibiotalar joint line tenderness. Ortho Exam  Specialty Comments:  No specialty comments available.  Imaging: No results found.   PMFS History: Patient Active Problem List   Diagnosis Date Noted  . Lumbar stenosis 09/09/2017  . Spinal stenosis of lumbar region with neurogenic claudication 09/04/2017  . Protrusion of lumbar intervertebral disc 09/04/2017   Past Medical History:  Diagnosis Date  . Arthritis   . Hypertension   . Seizures (HCC)    end of 2018 tests completed "episode"  . TIA (transient ischemic attack)    confusion at the end of 2018    History reviewed. No pertinent family history.  Past Surgical History:  Procedure Laterality Date  . ANKLE SURGERY Left 1976   metal implants  . aortic anyerusm repair    . COLONOSCOPY    . ESOPHAGOGASTRODUODENOSCOPY    . LUMBAR LAMINECTOMY/DECOMPRESSION MICRODISCECTOMY N/A 09/09/2017   Procedure: L4-5 DECOMPRESSION, LEFT L5-S1 FORAMINOTOMY;  Surgeon: Eldred Manges, MD;  Location: [facility] OR;  Service: Orthopedics;  Laterality: N/A;  . TONSILLECTOMY     Social History   Occupational History  . Not on file  Tobacco Use  . Smoking  status: Former Smoker    Last attempt to quit: 1980    Years since quitting: 39.1  . Smokeless tobacco: Never Used  Substance and Sexual Activity  . Alcohol use: Yes    Comment: wine  . Drug use: No  . Sexual activity: Not on file

## 2017-10-04 ENCOUNTER — Telehealth (INDEPENDENT_AMBULATORY_CARE_PROVIDER_SITE_OTHER): Payer: Self-pay | Admitting: Orthopaedic Surgery

## 2017-10-04 MED ORDER — HYDROCODONE-ACETAMINOPHEN 10-325 MG PO TABS: 0.5000 | ORAL_TABLET | Freq: Two times a day (BID) | ORAL | 0 refills | Status: AC | PRN

## 2017-10-04 NOTE — Telephone Encounter (Signed)
I called . Long discussion. She will use 2 aleve bid and extra strength tylenol 2 po bid. She is fine with that. FYI

## 2017-10-04 NOTE — Telephone Encounter (Signed)
Please advise 

## 2017-10-04 NOTE — Telephone Encounter (Signed)
OK refill norco . He can pick up next week.i called and discussed.

## 2017-10-04 NOTE — Addendum Note (Signed)
Addended by: Rogers SeedsYEATTS, Johanna Matto M on: 10/04/2017 04:15 PM   Modules accepted: Orders

## 2017-10-04 NOTE — Telephone Encounter (Signed)
Patient requesting RX refill of Hydrocodone, he says he has 18 left but would like to have them if possible. Patient said he has started walking at an indoor track at the hospital and he wants to know if he can get on a stationary bike. Please advise, can talk to wife if he is unavailable # [phone]

## 2017-10-04 NOTE — Telephone Encounter (Signed)
Printed and at front for pick up 

## 2017-10-16 ENCOUNTER — Ambulatory Visit (INDEPENDENT_AMBULATORY_CARE_PROVIDER_SITE_OTHER): Payer: Medicare HMO | Admitting: Surgery

## 2017-11-01 ENCOUNTER — Ambulatory Visit (INDEPENDENT_AMBULATORY_CARE_PROVIDER_SITE_OTHER): Payer: Medicare HMO | Admitting: Orthopaedic Surgery

## 2017-11-01 ENCOUNTER — Encounter (INDEPENDENT_AMBULATORY_CARE_PROVIDER_SITE_OTHER): Payer: Self-pay | Admitting: Orthopaedic Surgery

## 2017-11-01 VITALS — Ht 68.0 in | Wt 171.0 lb

## 2017-11-01 DIAGNOSIS — M7672 Peroneal tendinitis, left leg: Secondary | ICD-10-CM

## 2017-11-01 DIAGNOSIS — M25572 Pain in left ankle and joints of left foot: Secondary | ICD-10-CM | POA: Diagnosis not present

## 2017-11-01 DIAGNOSIS — M6788 Other specified disorders of synovium and tendon, other site: Secondary | ICD-10-CM

## 2017-11-01 NOTE — Progress Notes (Signed)
   Post-Op Visit Note   Patient: Matthew SquiresDavid L Brand           Date of Birth: 12/31/1933           MRN: [id number] Visit Date: 11/01/2017 PCP: Elijio MilesWeaver, John W., MD   Assessment & Plan: Swede-O applied for left ankle for peroneal tendinopathy.  Chief Complaint:  Chief Complaint  Patient presents with  . Left Ankle - Pain    S/p injection 10/02/17  . Lower Back - Follow-up    L4-5 decompression  L5-S1 foraminotomy    Visit Diagnoses: Left ankle pain.  Postop decompression lumbar L4-5 and L5-S1 foraminotomies.  Left ankle Swede-O applied and proper use discussed.  He has some swelling over the peroneal tendons posterior to the lateral malleolus that extends to the base of the fifth metatarsal on the left.  Hopefully his symptoms will settle down with use of the brace.  If not then MRI of his ankle can be obtained.  Pathophysiology of tendinopathy discussed.  He has good strength with resisted testing but this reproduces some of his pain.  Lumbar incision is well-healed and he is gotten good relief of his neurogenic claudication symptoms with lumbar decompression surgery.  He will call and return if he is having ongoing symptoms.   Follow-Up Instructions: No follow-ups on file.   Orders:  No orders of the defined types were placed in this encounter.  No orders of the defined types were placed in this encounter.   Imaging: No results found.  PMFS History: Patient Active Problem List   Diagnosis Date Noted  . Lumbar stenosis 09/09/2017  . Spinal stenosis of lumbar region with neurogenic claudication 09/04/2017  . Protrusion of lumbar intervertebral disc 09/04/2017   Past Medical History:  Diagnosis Date  . Arthritis   . Hypertension   . Seizures (HCC)    end of 2018 tests completed "episode"  . TIA (transient ischemic attack)    confusion at the end of 2018    No family history on file.  Past Surgical History:  Procedure Laterality Date  . ANKLE SURGERY Left 1976   metal  implants  . aortic anyerusm repair    . COLONOSCOPY    . ESOPHAGOGASTRODUODENOSCOPY    . LUMBAR LAMINECTOMY/DECOMPRESSION MICRODISCECTOMY N/A 09/09/2017   Procedure: L4-5 DECOMPRESSION, LEFT L5-S1 FORAMINOTOMY;  Surgeon: Eldred MangesYates, Saphira Lahmann C, MD;  Location: [facility] OR;  Service: Orthopedics;  Laterality: N/A;  . TONSILLECTOMY     Social History   Occupational History  . Not on file  Tobacco Use  . Smoking status: Former Smoker    Last attempt to quit: 1980    Years since quitting: 39.2  . Smokeless tobacco: Never Used  Substance and Sexual Activity  . Alcohol use: Yes    Comment: wine  . Drug use: No  . Sexual activity: Not on file

## 2017-11-05 ENCOUNTER — Ambulatory Visit (INDEPENDENT_AMBULATORY_CARE_PROVIDER_SITE_OTHER): Payer: Medicare HMO | Admitting: Orthopaedic Surgery

## 2017-11-06 ENCOUNTER — Ambulatory Visit (INDEPENDENT_AMBULATORY_CARE_PROVIDER_SITE_OTHER): Payer: Medicare HMO | Admitting: Orthopaedic Surgery

## 2017-11-10 ENCOUNTER — Encounter (INDEPENDENT_AMBULATORY_CARE_PROVIDER_SITE_OTHER): Payer: Self-pay | Admitting: Orthopaedic Surgery

## 2017-11-13 ENCOUNTER — Ambulatory Visit (INDEPENDENT_AMBULATORY_CARE_PROVIDER_SITE_OTHER): Payer: Medicare HMO | Admitting: Orthopaedic Surgery

## 2018-04-30 IMAGING — CR DG CHEST 2V
2 series · 2 of 2 positions shown · non-contrast
Comparison: 07/18/2015

CLINICAL DATA: Pre-op for lower back surgery - hx of exsmoker, htn,
seizures, TIA, no chest complaints

EXAM:
CHEST  2 VIEW

[w chest pa]
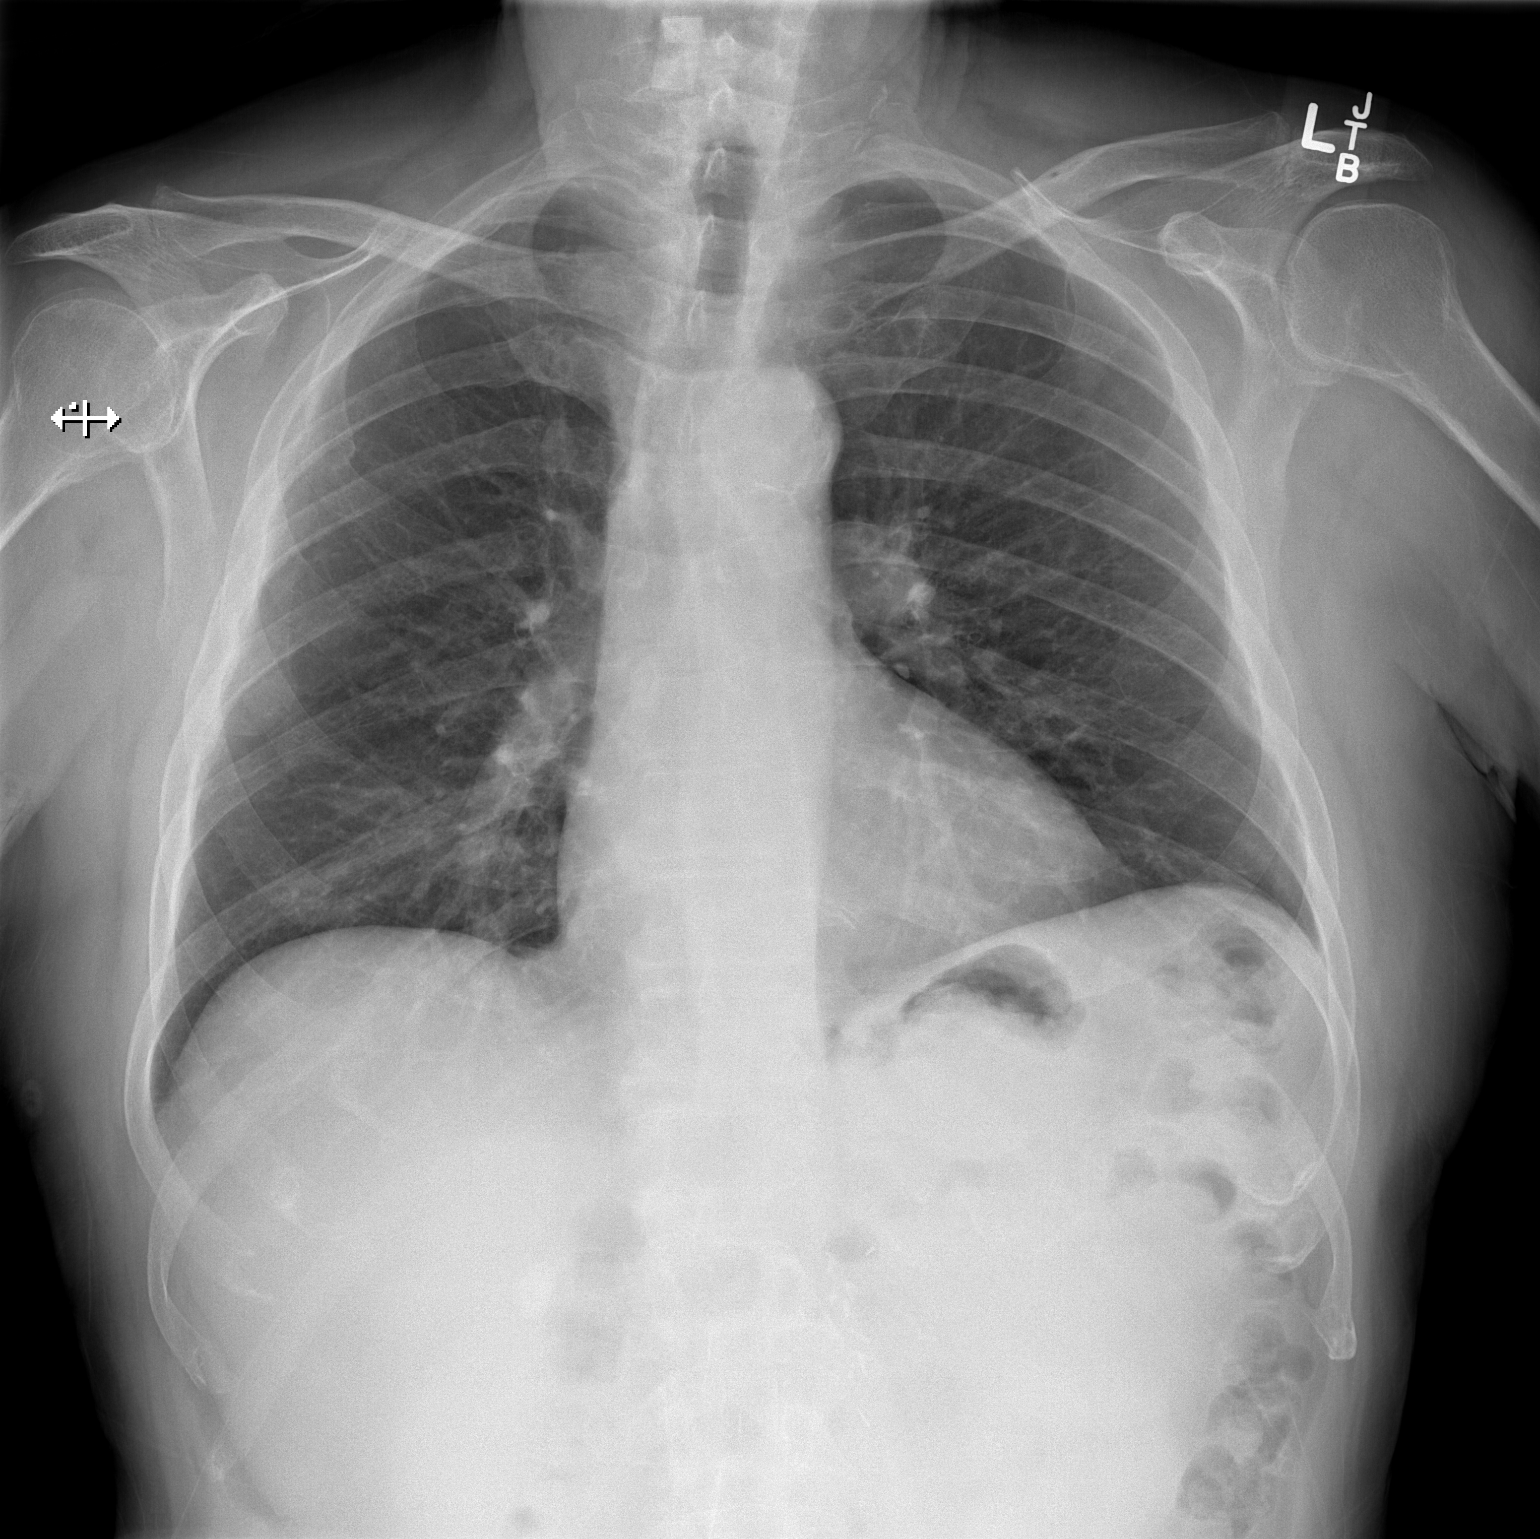

[w chest lat]
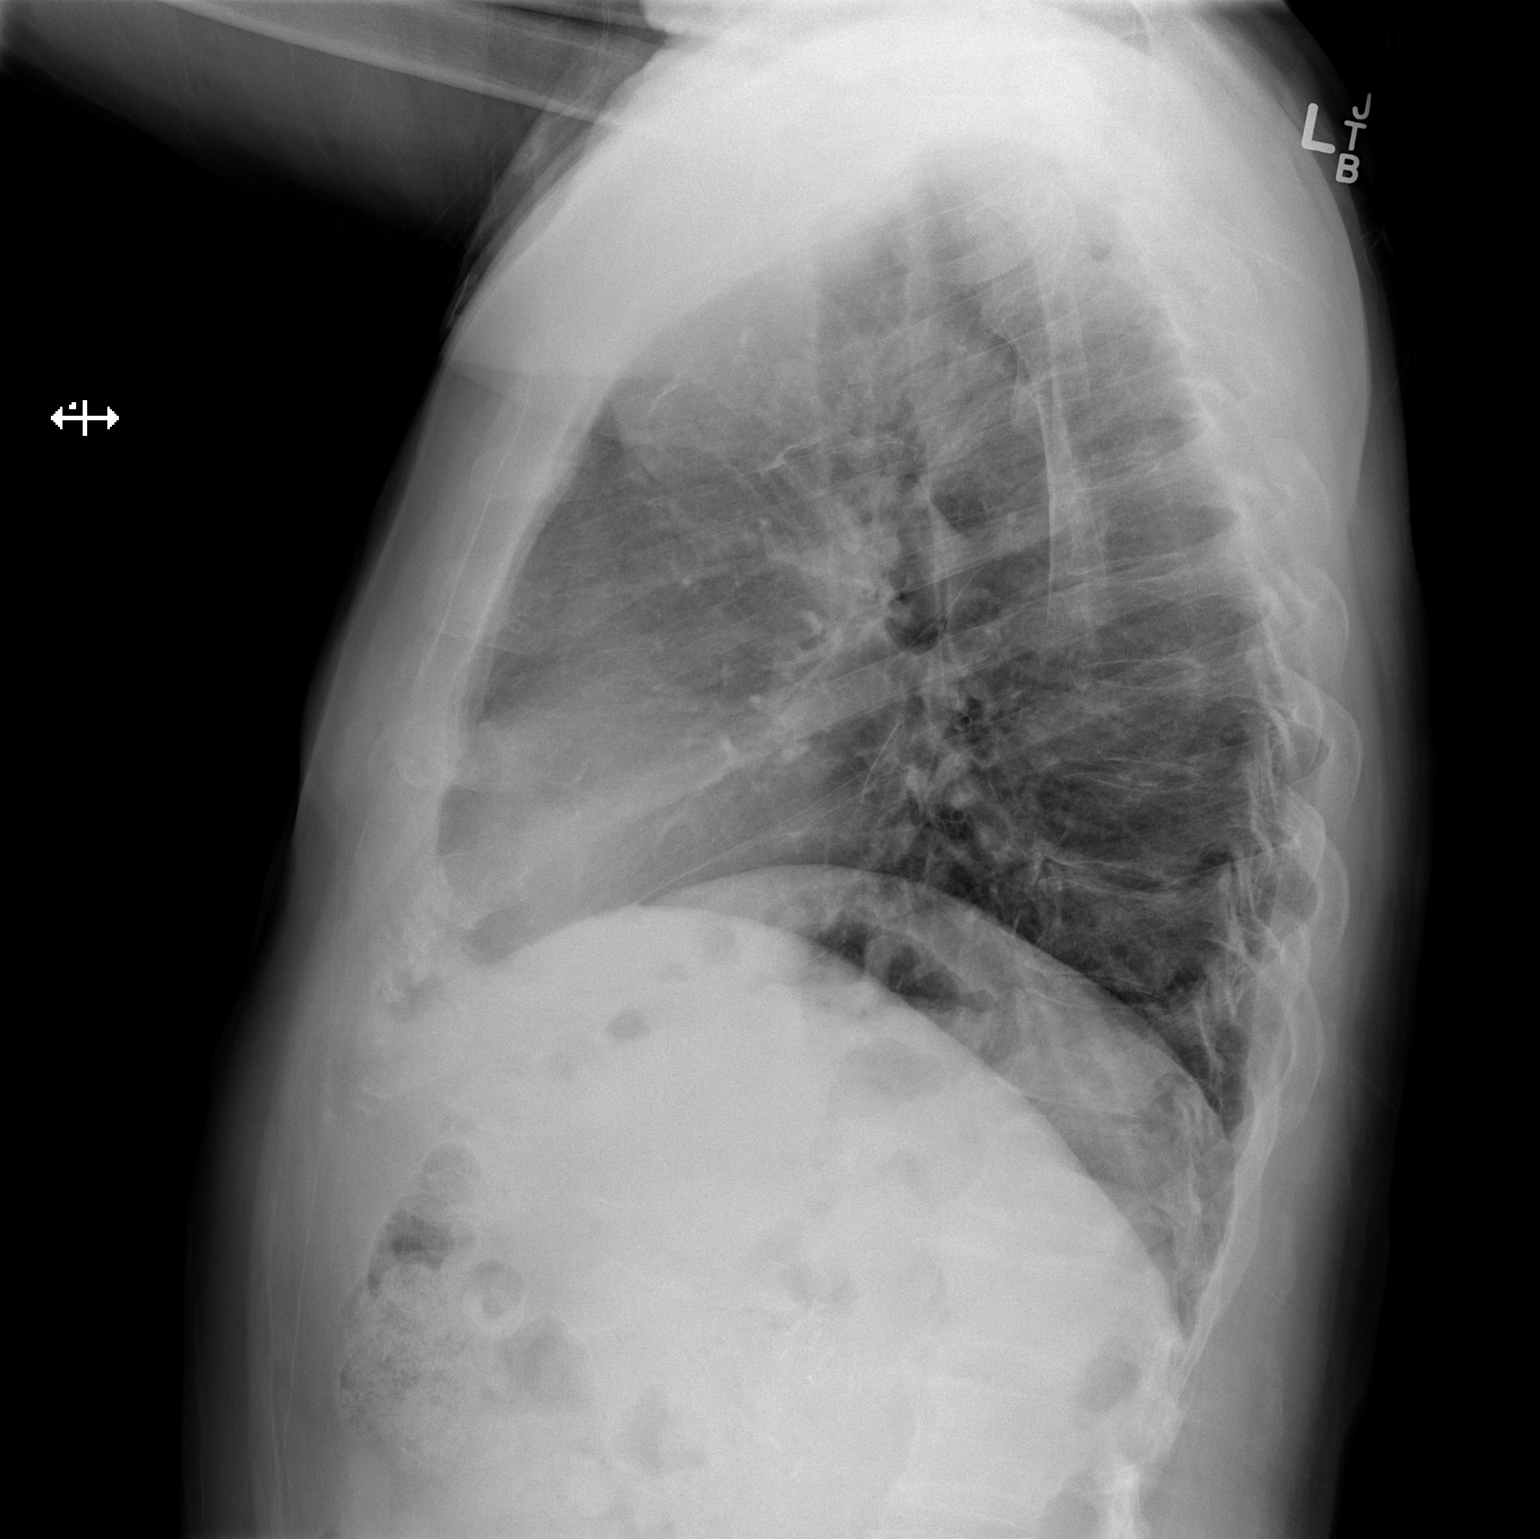

[2 of 2 positions shown; findings below may reference images not displayed]

FINDINGS: Cardiac silhouette is normal in size and configuration. Normal
mediastinal and hilar contours.

Clear lungs.

No pleural effusion or pneumothorax.

Skeletal structures are intact.
IMPRESSION: No active cardiopulmonary disease.

## 2018-05-03 IMAGING — CR DG LUMBAR SPINE 2-3V
3 series · 3 of 3 positions shown · non-contrast
Comparison: 04/21/2017 MR.

CLINICAL DATA: 83-year-old male for L4-5 decompression and L5-S1
foramina me. Initial encounter.

EXAM:
LUMBAR SPINE - 2-3 VIEW

[lateral (1 of 3)]
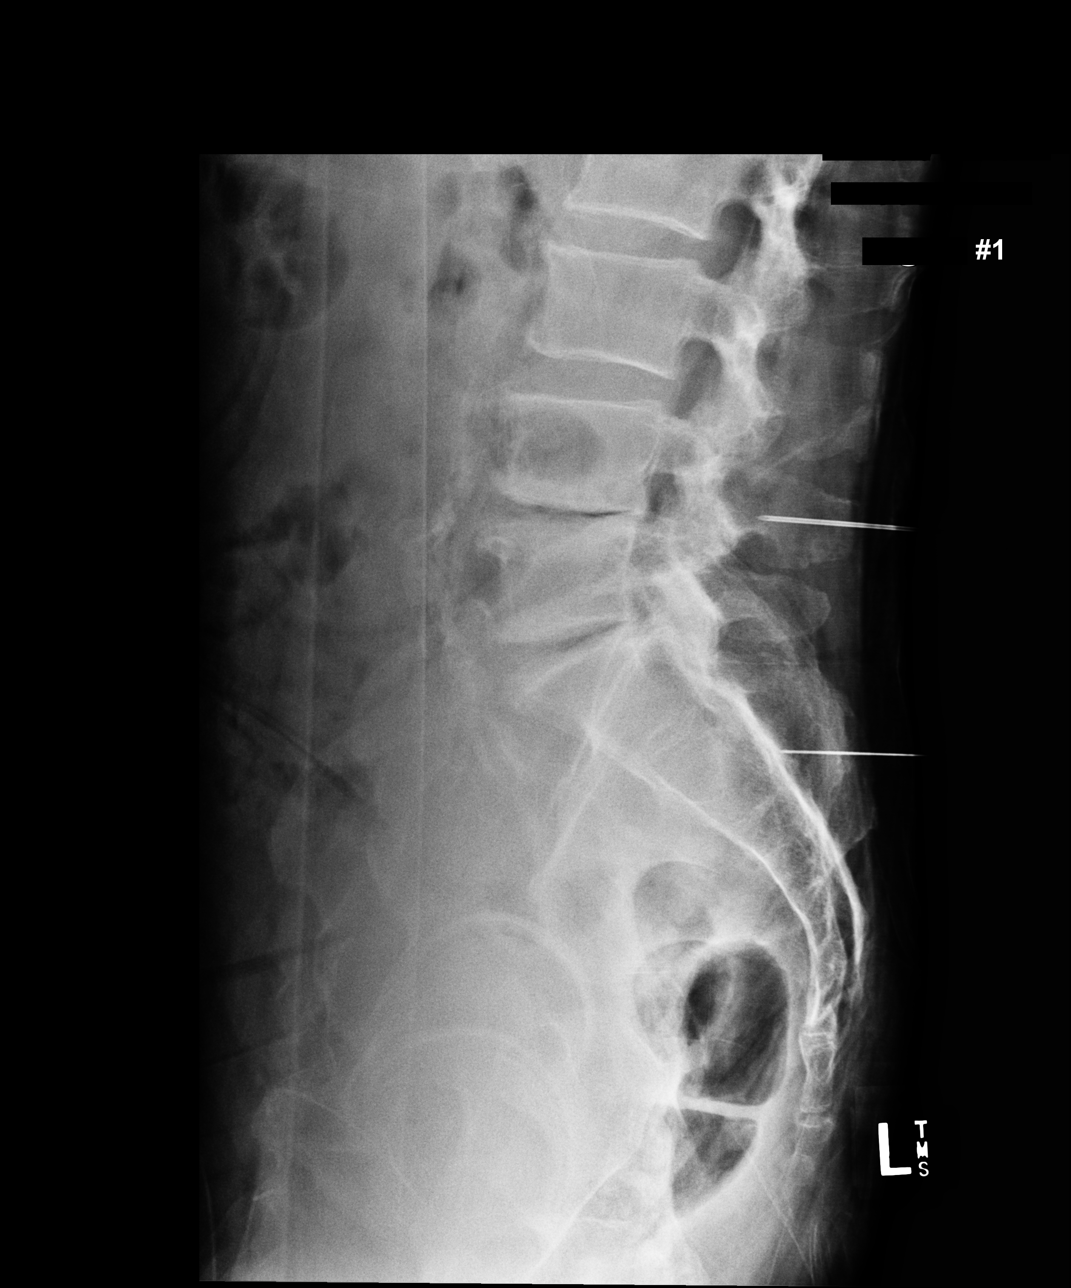

[lateral (2 of 3)]
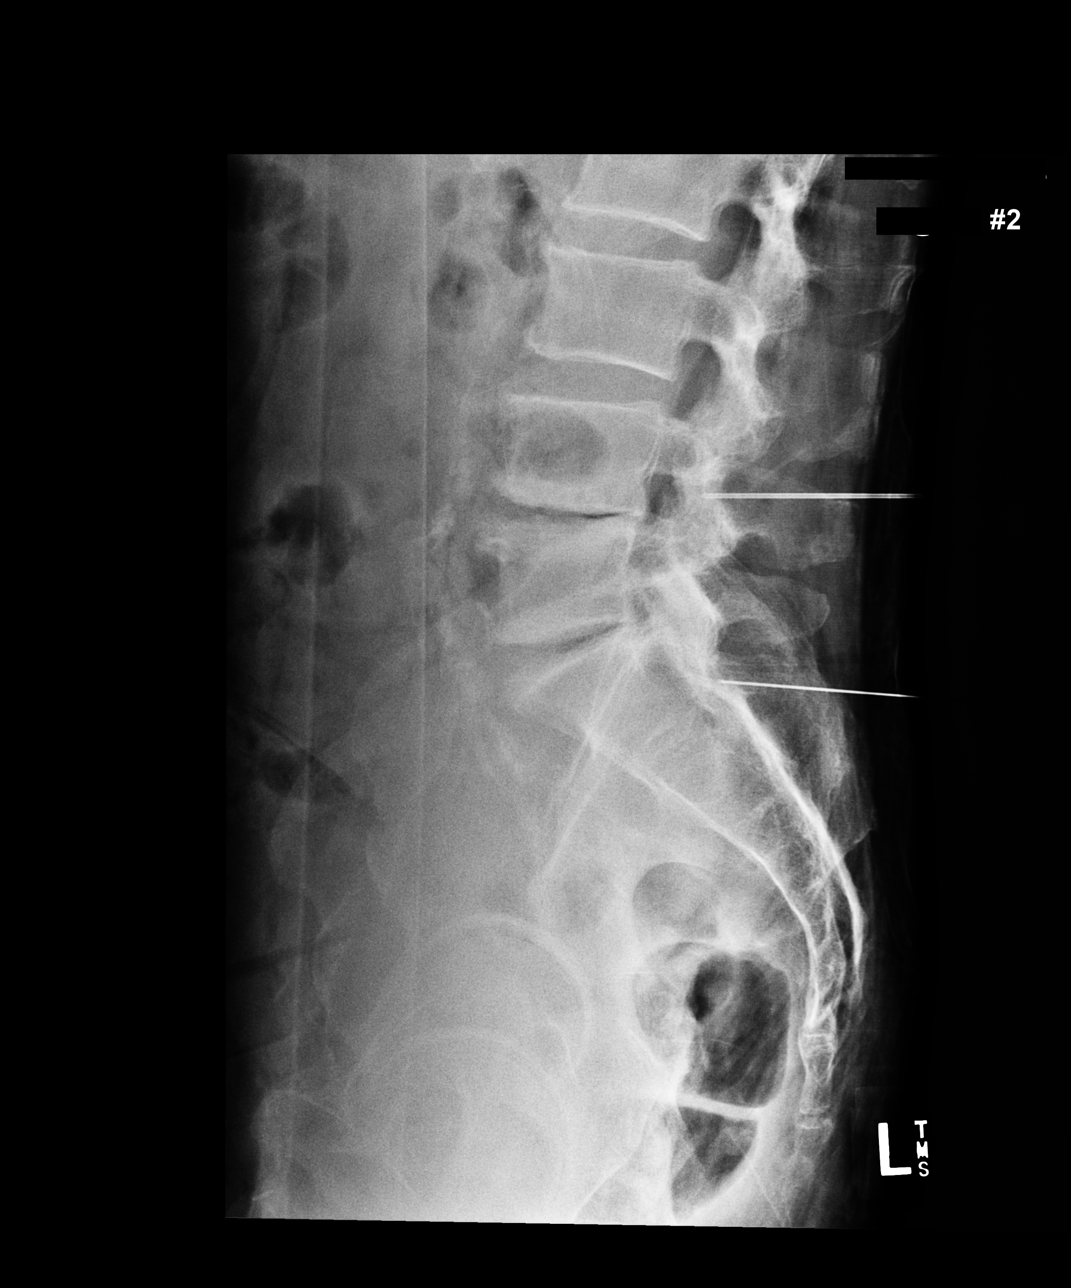

[lateral (3 of 3)]
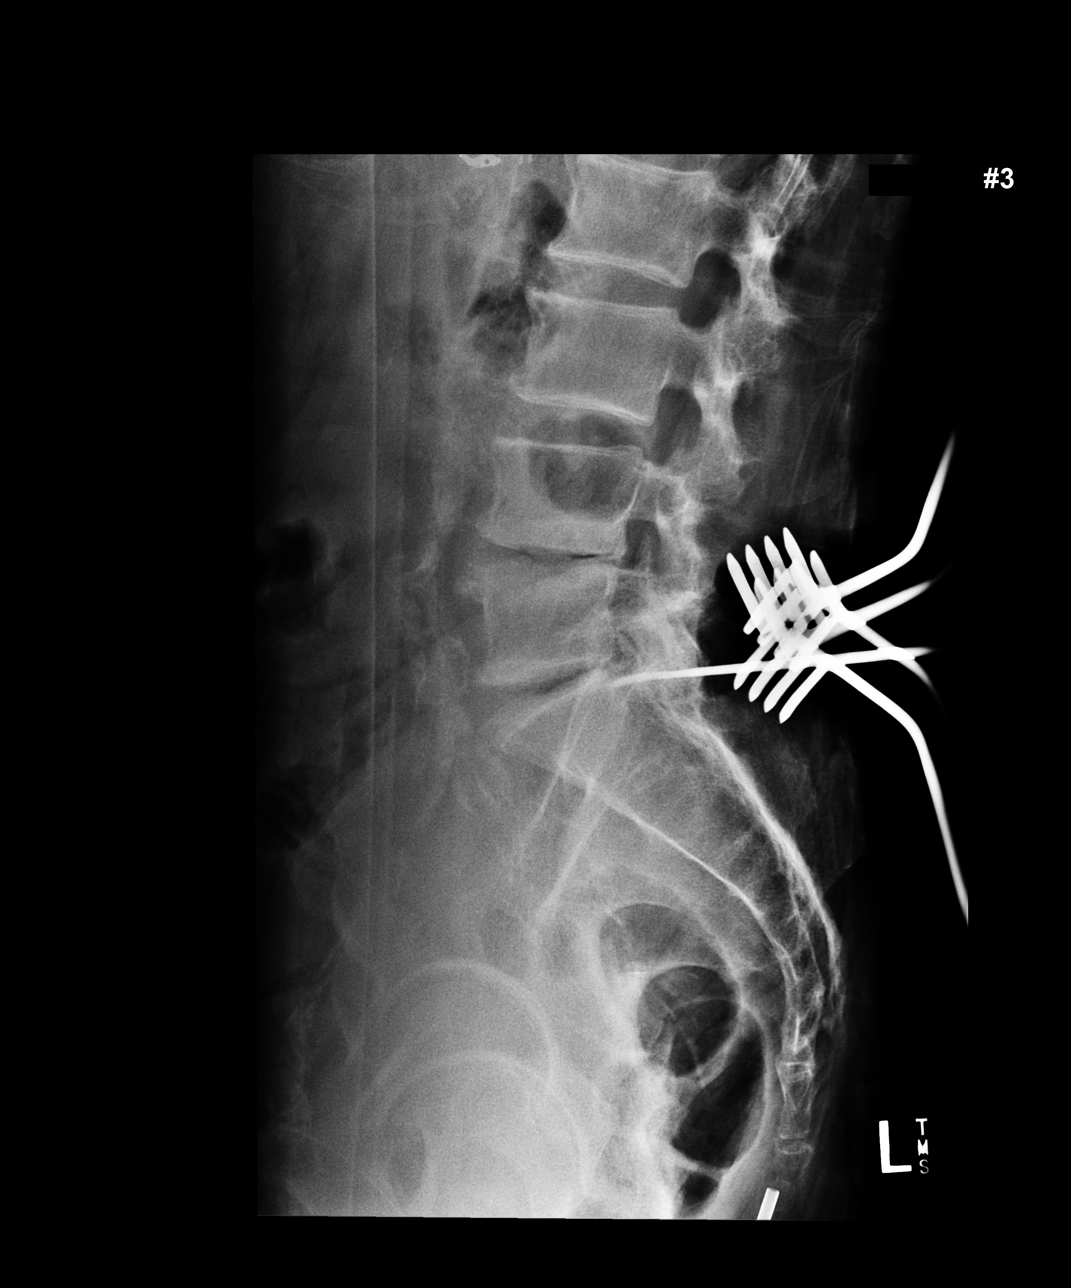

[3 of 3 positions shown; findings below may reference images not displayed]

FINDINGS: Three intraoperative lateral views of the lumbar spine submitted for
review.

Level assignment as per prior MR with last fully open disc space
L5-S1.

First film submitted for review labeled 09/09/2017 [DATE] p.m.
reveals superior metallic probe overlying the L4 spinous process and
inferior metallic probe directed towards the S2 vertebral body.

Second film submitted for review labeled 09/09/2017 [DATE] p.m.
reveals superior metallic probe overlying the L4 spinous process and
inferior metallic probe directed towards the S1 vertebral body.

Third film submitted for review labeled 09/09/2017 [DATE] p.m. reveals
metallic probe just inferior to the L5-S1 disc space.

Vascular calcifications.
IMPRESSION: Localization L5-S1 on third film submitted for review as noted
above.
